# Patient Record
Sex: Female | Born: 1970 | Race: Black or African American | Hispanic: No | Marital: Single | Smoking: Never smoker
Health system: Southern US, Community
[De-identification: ages and names within clinical notes are randomized; demographics above are authoritative.]

## PROBLEM LIST (undated history)

## (undated) DIAGNOSIS — G9601 Cranial cerebrospinal fluid leak, spontaneous: Secondary | ICD-10-CM

## (undated) DIAGNOSIS — M549 Dorsalgia, unspecified: Secondary | ICD-10-CM

## (undated) DIAGNOSIS — G039 Meningitis, unspecified: Secondary | ICD-10-CM

## (undated) DIAGNOSIS — Z8719 Personal history of other diseases of the digestive system: Secondary | ICD-10-CM

## (undated) DIAGNOSIS — I10 Essential (primary) hypertension: Secondary | ICD-10-CM

## (undated) DIAGNOSIS — G43909 Migraine, unspecified, not intractable, without status migrainosus: Secondary | ICD-10-CM

## (undated) DIAGNOSIS — Z955 Presence of coronary angioplasty implant and graft: Secondary | ICD-10-CM

## (undated) DIAGNOSIS — K8020 Calculus of gallbladder without cholecystitis without obstruction: Secondary | ICD-10-CM

## (undated) DIAGNOSIS — R51 Headache: Secondary | ICD-10-CM

## (undated) DIAGNOSIS — M069 Rheumatoid arthritis, unspecified: Secondary | ICD-10-CM

## (undated) DIAGNOSIS — D649 Anemia, unspecified: Secondary | ICD-10-CM

## (undated) DIAGNOSIS — G8929 Other chronic pain: Secondary | ICD-10-CM

## (undated) HISTORY — PX: HIP SURGERY: SHX245

## (undated) HISTORY — PX: RIGHT AND LEFT HEART CATH: CATH118262

## (undated) HISTORY — PX: APPENDECTOMY: SHX54

## (undated) HISTORY — PX: ABDOMINAL HYSTERECTOMY: SHX81

## (undated) HISTORY — PX: TONSILLECTOMY: SUR1361

## (undated) HISTORY — PX: CORONARY BALLOON ANGIOPLASTY: CATH118233

## (undated) HISTORY — PX: CORONARY STENT INTERVENTION: CATH118234

## (undated) HISTORY — PX: HERNIA REPAIR: SHX51

---

## 2006-09-22 ENCOUNTER — Emergency Department (HOSPITAL_COMMUNITY): Admission: EM | Admit: 2006-09-22 | Discharge: 2006-09-23 | Payer: Self-pay | Admitting: Emergency Medicine

## 2008-11-28 ENCOUNTER — Emergency Department (HOSPITAL_BASED_OUTPATIENT_CLINIC_OR_DEPARTMENT_OTHER): Admission: EM | Admit: 2008-11-28 | Discharge: 2008-11-28 | Payer: Self-pay | Admitting: Emergency Medicine

## 2009-03-12 ENCOUNTER — Emergency Department (HOSPITAL_BASED_OUTPATIENT_CLINIC_OR_DEPARTMENT_OTHER): Admission: EM | Admit: 2009-03-12 | Discharge: 2009-03-12 | Payer: Self-pay | Admitting: Emergency Medicine

## 2009-07-30 ENCOUNTER — Emergency Department (HOSPITAL_BASED_OUTPATIENT_CLINIC_OR_DEPARTMENT_OTHER): Admission: EM | Admit: 2009-07-30 | Discharge: 2009-07-31 | Payer: Self-pay | Admitting: Emergency Medicine

## 2009-07-31 ENCOUNTER — Ambulatory Visit: Payer: Self-pay | Admitting: Diagnostic Radiology

## 2010-05-02 ENCOUNTER — Ambulatory Visit: Payer: Self-pay | Admitting: Diagnostic Radiology

## 2010-05-02 ENCOUNTER — Ambulatory Visit (HOSPITAL_BASED_OUTPATIENT_CLINIC_OR_DEPARTMENT_OTHER)
Admission: RE | Admit: 2010-05-02 | Discharge: 2010-05-02 | Payer: Self-pay | Source: Home / Self Care | Admitting: Orthopedic Surgery

## 2010-06-15 ENCOUNTER — Encounter: Admission: RE | Admit: 2010-06-15 | Discharge: 2010-06-15 | Payer: Self-pay | Admitting: Orthopedic Surgery

## 2011-04-06 LAB — GC/CHLAMYDIA PROBE AMP, GENITAL: Chlamydia, DNA Probe: NEGATIVE

## 2011-04-06 LAB — URINALYSIS, ROUTINE W REFLEX MICROSCOPIC
Bilirubin Urine: NEGATIVE
Glucose, UA: NEGATIVE mg/dL
Leukocytes, UA: NEGATIVE
Nitrite: NEGATIVE
Protein, ur: NEGATIVE mg/dL
Urobilinogen, UA: 0.2 mg/dL (ref 0.0–1.0)

## 2011-04-06 LAB — BASIC METABOLIC PANEL
GFR calc Af Amer: >60 mL/min (ref >60)
Sodium: 140 mEq/L (ref 135–145)

## 2011-04-06 LAB — CBC
HCT: 30.4 % — ABNORMAL LOW (ref 36.0–46.0)
RBC: 4.73 MIL/uL (ref 3.87–5.11)
RDW: 17.7 % — ABNORMAL HIGH (ref 11.5–15.5)
WBC: 7.6 10*3/uL (ref 4.0–10.5)

## 2011-04-06 LAB — DIFFERENTIAL
Basophils Relative: 0 % (ref 0–1)
Eosinophils Absolute: 0.1 10*3/uL (ref 0.0–0.7)
Lymphocytes Relative: 38 % (ref 12–46)
Monocytes Absolute: 0.5 10*3/uL (ref 0.1–1.0)
Neutrophils Relative %: 54 % (ref 43–77)

## 2011-04-06 LAB — PREGNANCY, URINE: Preg Test, Ur: NEGATIVE

## 2011-04-06 LAB — URINE MICROSCOPIC-ADD ON

## 2011-04-06 LAB — WET PREP, GENITAL

## 2011-10-01 LAB — URINALYSIS, ROUTINE W REFLEX MICROSCOPIC
Bilirubin Urine: NEGATIVE
Glucose, UA: NEGATIVE
Hgb urine dipstick: NEGATIVE
Protein, ur: NEGATIVE
Specific Gravity, Urine: 1.026
Urobilinogen, UA: 0.2

## 2011-10-01 LAB — DIFFERENTIAL
Basophils Absolute: 0.1
Eosinophils Absolute: 0.1
Lymphs Abs: 2.9
Monocytes Absolute: 0.7
Neutro Abs: 4.6
Neutrophils Relative %: 55

## 2011-10-01 LAB — COMPREHENSIVE METABOLIC PANEL
AST: 17
Alkaline Phosphatase: 54
Calcium: 9.4
GFR calc non Af Amer: >60
Total Bilirubin: 0.3
Total Protein: 7.5

## 2011-10-01 LAB — OCCULT BLOOD X 1 CARD TO LAB, STOOL: Fecal Occult Bld: POSITIVE

## 2011-10-01 LAB — CBC
Hemoglobin: 7.4 — CL
MCHC: 29.9 — ABNORMAL LOW
WBC: 8.4

## 2011-10-01 LAB — PREGNANCY, URINE: Preg Test, Ur: NEGATIVE

## 2011-11-24 DIAGNOSIS — F32A Depression, unspecified: Secondary | ICD-10-CM | POA: Insufficient documentation

## 2011-11-24 DIAGNOSIS — N281 Cyst of kidney, acquired: Secondary | ICD-10-CM | POA: Insufficient documentation

## 2011-11-24 DIAGNOSIS — F329 Major depressive disorder, single episode, unspecified: Secondary | ICD-10-CM | POA: Insufficient documentation

## 2011-11-24 DIAGNOSIS — Z90710 Acquired absence of both cervix and uterus: Secondary | ICD-10-CM | POA: Insufficient documentation

## 2011-11-24 DIAGNOSIS — N6009 Solitary cyst of unspecified breast: Secondary | ICD-10-CM | POA: Insufficient documentation

## 2012-06-12 ENCOUNTER — Emergency Department (HOSPITAL_BASED_OUTPATIENT_CLINIC_OR_DEPARTMENT_OTHER): Payer: BC Managed Care – PPO

## 2012-06-12 ENCOUNTER — Emergency Department (HOSPITAL_BASED_OUTPATIENT_CLINIC_OR_DEPARTMENT_OTHER)
Admission: EM | Admit: 2012-06-12 | Discharge: 2012-06-13 | Disposition: A | Discharge status: Home / Self Care | Payer: BC Managed Care – PPO | Attending: Emergency Medicine | Admitting: Emergency Medicine

## 2012-06-12 ENCOUNTER — Encounter (HOSPITAL_BASED_OUTPATIENT_CLINIC_OR_DEPARTMENT_OTHER): Payer: Self-pay | Admitting: *Deleted

## 2012-06-12 DIAGNOSIS — I10 Essential (primary) hypertension: Secondary | ICD-10-CM | POA: Insufficient documentation

## 2012-06-12 DIAGNOSIS — S335XXA Sprain of ligaments of lumbar spine, initial encounter: Secondary | ICD-10-CM | POA: Insufficient documentation

## 2012-06-12 DIAGNOSIS — E119 Type 2 diabetes mellitus without complications: Secondary | ICD-10-CM | POA: Insufficient documentation

## 2012-06-12 DIAGNOSIS — X58XXXA Exposure to other specified factors, initial encounter: Secondary | ICD-10-CM | POA: Insufficient documentation

## 2012-06-12 DIAGNOSIS — S39012A Strain of muscle, fascia and tendon of lower back, initial encounter: Secondary | ICD-10-CM

## 2012-06-12 HISTORY — DX: Essential (primary) hypertension: I10

## 2012-06-12 LAB — URINALYSIS, ROUTINE W REFLEX MICROSCOPIC
Protein, ur: NEGATIVE mg/dL
Specific Gravity, Urine: 1.038 — ABNORMAL HIGH (ref 1.005–1.030)
pH: 6.5 (ref 5.0–8.0)

## 2012-06-12 MED ORDER — TRAMADOL HCL 50 MG PO TABS
50.0000 mg | ORAL_TABLET | Freq: Once | ORAL | Status: AC
  Administered 2012-06-12: 50 mg via ORAL
  Filled 2012-06-12 (×2): qty 1

## 2012-06-12 NOTE — ED Notes (Signed)
Pt c/o lower back pain x 1 month 

## 2012-06-13 MED ORDER — HYDROCODONE-ACETAMINOPHEN 5-500 MG PO TABS: 1.0000 | ORAL_TABLET | Freq: Four times a day (QID) | ORAL | Status: AC | PRN

## 2012-06-13 MED ORDER — METHOCARBAMOL 500 MG PO TABS: 500.0000 mg | ORAL_TABLET | Freq: Two times a day (BID) | ORAL | Status: AC

## 2012-06-13 NOTE — ED Provider Notes (Signed)
History     CSN: [id number]  Arrival date & time 06/12/12  1952   First MD Initiated Contact with Patient 06/12/12 2255      Chief Complaint  Patient presents with  . Back Pain    (Consider location/radiation/quality/duration/timing/severity/associated sxs/prior treatment) Patient is a 41 y.o. female presenting with back pain. The history is provided by the patient. No language interpreter was used.  Back Pain  This is a chronic problem. The current episode started more than 1 week ago. The problem occurs constantly. The problem has not changed since onset.The pain is associated with no known injury. The pain is present in the lumbar spine. The quality of the pain is described as stabbing. The pain does not radiate. The pain is severe. The symptoms are aggravated by bending. The pain is the same all the time. Pertinent negatives include no chest pain, no fever, no numbness, no weight loss, no abdominal pain, no abdominal swelling, no bowel incontinence, no perianal numbness, no bladder incontinence, no dysuria, no pelvic pain, no leg pain, no paresthesias, no paresis, no tingling and no weakness. She has tried nothing for the symptoms. The treatment provided no relief. Risk factors include obesity.    Past Medical History  Diagnosis Date  . Hypertension   . Diabetes mellitus     Past Surgical History  Procedure Date  . Abdominal hysterectomy   . Appendectomy   . Tonsillectomy   . Cesarean section     History reviewed. No pertinent family history.  History  Substance Use Topics  . Smoking status: Never Smoker   . Smokeless tobacco: Not on file  . Alcohol Use: No    OB History    Grav Para Term Preterm Abortions TAB SAB Ect Mult Living                  Review of Systems  Constitutional: Negative for fever and weight loss.  Respiratory: Negative for cough and shortness of breath.   Cardiovascular: Negative for chest pain.  Gastrointestinal: Negative for abdominal  pain and bowel incontinence.  Genitourinary: Negative for bladder incontinence, dysuria, frequency, flank pain, vaginal discharge and pelvic pain.  Musculoskeletal: Positive for back pain.  Neurological: Negative for tingling, weakness, numbness and paresthesias.  All other systems reviewed and are negative.    Allergies  Azithromycin  Home Medications   Current Outpatient Rx  Name Route Sig Dispense Refill  . GABAPENTIN 300 MG PO CAPS Oral Take 300 mg by mouth 3 (three) times daily.    . IBUPROFEN 200 MG PO TABS Oral Take 400 mg by mouth every 6 (six) hours as needed. Patient used this medication for back pain.    Marland Kitchen LISINOPRIL 20 MG PO TABS Oral Take 20 mg by mouth daily.    Marland Kitchen METFORMIN HCL 1000 MG PO TABS Oral Take 1,000 mg by mouth 2 (two) times daily with a meal.      BP 166/100  Pulse 91  Temp 98 F (36.7 C)  Resp 16  Ht 5\' 5"  (1.651 m)  Wt 241 lb (109.317 kg)  BMI 40.10 kg/m2  SpO2 100%  Physical Exam  Constitutional: She is oriented to person, place, and time. She appears well-developed and well-nourished.  HENT:  Head: Normocephalic and atraumatic.  Mouth/Throat: Oropharynx is clear and moist.  Eyes: Conjunctivae are normal. Pupils are equal, round, and reactive to light.  Neck: Normal range of motion. Neck supple.  Cardiovascular: Normal rate and regular rhythm.   Pulmonary/Chest:  Effort normal and breath sounds normal.  Abdominal: Soft. Bowel sounds are normal.  Musculoskeletal: Normal range of motion. She exhibits no tenderness.  Neurological: She is alert and oriented to person, place, and time. She has normal reflexes.       No step off, nor point tenderness or crepitance over spine.  Intact L5/s1 intact perineal sensation, intact gait   Skin: Skin is warm and dry.  Psychiatric: She has a normal mood and affect.    ED Course  Procedures (including critical care time)  Labs Reviewed  URINALYSIS, ROUTINE W REFLEX MICROSCOPIC - Abnormal; Notable for the  following:    Color, Urine AMBER (*)  BIOCHEMICALS MAY BE AFFECTED BY COLOR   Specific Gravity, Urine 1.038 (*)     Bilirubin Urine SMALL (*)     Ketones, ur 15 (*)     All other components within normal limits  PREGNANCY, URINE   Dg Lumbar Spine Complete  06/13/2012  *RADIOLOGY REPORT*  Clinical Data: Lower back pain for 1 month.  LUMBAR SPINE - COMPLETE 4+ VIEW  Comparison: MRI of the lumbar spine performed 05/02/2010  Findings: There is no evidence of fracture or subluxation. Vertebral bodies demonstrate normal height and alignment. Intervertebral disc spaces are preserved.  The visualized neural foramina are grossly unremarkable in appearance.  The visualized bowel gas pattern is unremarkable in appearance; air and stool are noted within the colon.  The sacroiliac joints are within normal limits.  As on the prior CT from 07/31/2009, there is only a left-sided tubal ligation clip.  IMPRESSION: No evidence of fracture or subluxation along the lumbar spine.  Original Report Authenticated By: Tonia Ghent, M.D.     No diagnosis found.    MDM  Lumbar strain.  No indication for labs or advanced imaging.  Will provide pain medication.  Follow up with your family doctor.  Return for weakness numbness, bowel or bladder incontinence, fevers or any concerns.  Patient verbalizes understanding and agrees to follow up        Avacyn Kloosterman Smitty Cords, MD 06/13/12 [phone]

## 2012-06-13 NOTE — Discharge Instructions (Signed)
Back Pain, Adult Low back pain is very common. About 1 in 5 people have back pain.The cause of low back pain is rarely dangerous. The pain often gets better over time.About half of people with a sudden onset of back pain feel better in just 2 weeks. About 8 in 10 people feel better by 6 weeks.  CAUSES Some common causes of back pain include:  Strain of the muscles or ligaments supporting the spine.   Wear and tear (degeneration) of the spinal discs.   Arthritis.   Direct injury to the back.  DIAGNOSIS Most of the time, the direct cause of low back pain is not known.However, back pain can be treated effectively even when the exact cause of the pain is unknown.Answering your caregiver's questions about your overall health and symptoms is one of the most accurate ways to make sure the cause of your pain is not dangerous. If your caregiver needs more information, he or she may order lab work or imaging tests (X-rays or MRIs).However, even if imaging tests show changes in your back, this usually does not require surgery. HOME CARE INSTRUCTIONS For many people, back pain returns.Since low back pain is rarely dangerous, it is often a condition that people can learn to manageon their own.   Remain active. It is stressful on the back to sit or stand in one place. Do not sit, drive, or stand in one place for more than 30 minutes at a time. Take short walks on level surfaces as soon as pain allows.Try to increase the length of time you walk each day.   Do not stay in bed.Resting more than 1 or 2 days can delay your recovery.   Do not avoid exercise or work.Your body is made to move.It is not dangerous to be active, even though your back may hurt.Your back will likely heal faster if you return to being active before your pain is gone.   Pay attention to your body when you bend and lift. Many people have less discomfortwhen lifting if they bend their knees, keep the load close to their  bodies,and avoid twisting. Often, the most comfortable positions are those that put less stress on your recovering back.   Find a comfortable position to sleep. Use a firm mattress and lie on your side with your knees slightly bent. If you lie on your back, put a pillow under your knees.   Only take over-the-counter or prescription medicines as directed by your caregiver. Over-the-counter medicines to reduce pain and inflammation are often the most helpful.Your caregiver may prescribe muscle relaxant drugs.These medicines help dull your pain so you can more quickly return to your normal activities and healthy exercise.   Put ice on the injured area.   Put ice in a plastic bag.   Place a towel between your skin and the bag.   Leave the ice on for 15 to 20 minutes, 3 to 4 times a day for the first 2 to 3 days. After that, ice and heat may be alternated to reduce pain and spasms.   Ask your caregiver about trying back exercises and gentle massage. This may be of some benefit.   Avoid feeling anxious or stressed.Stress increases muscle tension and can worsen back pain.It is important to recognize when you are anxious or stressed and learn ways to manage it.Exercise is a great option.  SEEK MEDICAL CARE IF:  You have pain that is not relieved with rest or medicine.   You have   pain that does not improve in 1 week.   You have new symptoms.   You are generally not feeling well.  SEEK IMMEDIATE MEDICAL CARE IF:   You have pain that radiates from your back into your legs.   You develop new bowel or bladder control problems.   You have unusual weakness or numbness in your arms or legs.   You develop nausea or vomiting.   You develop abdominal pain.   You feel faint.  Document Released: 12/16/2005 Document Revised: 12/05/2011 Document Reviewed: 05/06/2011 [employer] Patient Information 2012 [employer]. 

## 2013-04-06 ENCOUNTER — Emergency Department (HOSPITAL_BASED_OUTPATIENT_CLINIC_OR_DEPARTMENT_OTHER)
Admission: EM | Admit: 2013-04-06 | Discharge: 2013-04-06 | Disposition: A | Discharge status: Home / Self Care | Payer: BC Managed Care – PPO | Attending: Emergency Medicine | Admitting: Emergency Medicine

## 2013-04-06 ENCOUNTER — Encounter (HOSPITAL_BASED_OUTPATIENT_CLINIC_OR_DEPARTMENT_OTHER): Payer: Self-pay

## 2013-04-06 DIAGNOSIS — Z79899 Other long term (current) drug therapy: Secondary | ICD-10-CM | POA: Insufficient documentation

## 2013-04-06 DIAGNOSIS — I10 Essential (primary) hypertension: Secondary | ICD-10-CM

## 2013-04-06 DIAGNOSIS — E119 Type 2 diabetes mellitus without complications: Secondary | ICD-10-CM | POA: Insufficient documentation

## 2013-04-06 LAB — CBC WITH DIFFERENTIAL/PLATELET
Basophils Absolute: 0 10*3/uL (ref 0.0–0.1)
Basophils Relative: 0 % (ref 0–1)
HCT: 39.2 % (ref 36.0–46.0)
MCHC: 33.7 g/dL (ref 30.0–36.0)
Monocytes Absolute: 0.5 10*3/uL (ref 0.1–1.0)
Neutro Abs: 2.3 10*3/uL (ref 1.7–7.7)
Neutrophils Relative %: 38 % — ABNORMAL LOW (ref 43–77)
Platelets: 250 10*3/uL (ref 150–400)
RDW: 12.7 % (ref 11.5–15.5)

## 2013-04-06 LAB — BASIC METABOLIC PANEL
Chloride: 101 mEq/L (ref 96–112)
Creatinine, Ser: 0.6 mg/dL (ref 0.50–1.10)
GFR calc Af Amer: >90 mL/min (ref >90)
Sodium: 139 mEq/L (ref 135–145)

## 2013-04-06 MED ORDER — METOPROLOL TARTRATE 1 MG/ML IV SOLN
5.0000 mg | Freq: Once | INTRAVENOUS | Status: AC
  Administered 2013-04-06: 5 mg via INTRAVENOUS
  Filled 2013-04-06: qty 5

## 2013-04-06 MED ORDER — AMLODIPINE BESYLATE 5 MG PO TABS
10.0000 mg | ORAL_TABLET | Freq: Once | ORAL | Status: AC
  Administered 2013-04-06: 10 mg via ORAL
  Filled 2013-04-06: qty 2

## 2013-04-06 MED ORDER — AMLODIPINE BESYLATE 10 MG PO TABS: 5.0000 mg | ORAL_TABLET | Freq: Every day | ORAL | Status: DC

## 2013-04-06 NOTE — ED Notes (Signed)
C/o elevated BP x 2 weeks-tingling to left arm started yesterday-pressure to face

## 2013-04-06 NOTE — ED Notes (Signed)
Md made aware of BP cont to d/c

## 2013-04-06 NOTE — ED Provider Notes (Addendum)
History     CSN: [id number]  Arrival date & time 04/06/13  1958   First MD Initiated Contact with Patient 04/06/13 2017      Chief Complaint  Patient presents with  . Hypertension    (Consider location/radiation/quality/duration/timing/severity/associated sxs/prior treatment) HPI 42 y.o. Female with history of hypertension went to [facility] for hypertension and states bp continues high.  Patient states seen at [facility] and given one time bp pill and felt a little better.  Continues with losartan since October and hasn't been back to primary care doctor.  Complains of pressure in face and seen at [facility] then called clinic and told her to go to hospital.  Takes losartan at 11 a.m.  BP checks bp in the morning before work and has been elevated at 170/118, 168/119 in a.m.  Feels pressure in face.  BP elevated since 20s, "runs in family".   Past Medical History  Diagnosis Date  . Hypertension   . Diabetes mellitus     Past Surgical History  Procedure Laterality Date  . Abdominal hysterectomy    . Appendectomy    . Tonsillectomy    . Cesarean section      No family history on file.  History  Substance Use Topics  . Smoking status: Never Smoker   . Smokeless tobacco: Not on file  . Alcohol Use: No    OB History   Grav Para Term Preterm Abortions TAB SAB Ect Mult Living                  Review of Systems  All other systems reviewed and are negative.    Allergies  Erythromycin  Home Medications   Current Outpatient Rx  Name  Route  Sig  Dispense  Refill  . losartan-hydrochlorothiazide (HYZAAR) 100-25 MG per tablet   Oral   Take 1 tablet by mouth daily.         Marland Kitchen gabapentin (NEURONTIN) 300 MG capsule   Oral   Take 300 mg by mouth 3 (three) times daily.         Marland Kitchen ibuprofen (ADVIL,MOTRIN) 200 MG tablet   Oral   Take 400 mg by mouth every 6 (six) hours as needed. Patient used this medication for back pain.         Marland Kitchen lisinopril (PRINIVIL,ZESTRIL) 20 MG tablet   Oral   Take 20 mg by mouth daily.         . metFORMIN (GLUCOPHAGE) 1000 MG tablet   Oral   Take 1,000 mg by mouth 2 (two) times daily with a meal.           BP 182/111  Pulse 76  Temp(Src) 98.7 F (37.1 C) (Oral)  Resp 20  Ht 5\' 5"  (1.651 m)  Wt 230 lb (104.327 kg)  BMI 38.27 kg/m2  SpO2 98%  Physical Exam  Nursing note and vitals reviewed. Constitutional: She is oriented to person, place, and time. She appears well-developed and well-nourished.  HENT:  Head: Normocephalic and atraumatic.  Right Ear: External ear normal.  Left Ear: External ear normal.  Nose: Nose normal.  Mouth/Throat: Oropharynx is clear and moist.  Eyes: Conjunctivae and EOM are normal. Pupils are equal, round, and reactive to light.  Neck: Normal range of motion. Neck supple.  Cardiovascular: Normal rate and regular rhythm.   Pulmonary/Chest: Effort normal and breath sounds normal.  Abdominal: Soft. Bowel sounds are normal.  Musculoskeletal: Normal range of motion.  Neurological: She is alert and oriented to  person, place, and time. She has normal reflexes.  Skin: Skin is warm and dry.  Psychiatric: She has a normal mood and affect. Her behavior is normal. Judgment and thought content normal.    ED Course  Procedures (including critical care time)  Labs Reviewed - No data to display No results found.   No diagnosis found.    MDM  Patient with decreased blood pressure here with Lopressor and Norvasc is added to her regimen. She is advised that she should stay on Norvasc and call her doctor tomorrow to have her blood pressure rechecked as an outpatient. Recheck blood pressure here 135/84.       Hilario Quarry, MD 04/06/13 0454  Hilario Quarry, MD 04/24/13 1622  Hilario Quarry, MD 04/24/13 1623  Hilario Quarry, MD 04/24/13 0981

## 2013-05-27 DIAGNOSIS — E7800 Pure hypercholesterolemia, unspecified: Secondary | ICD-10-CM | POA: Insufficient documentation

## 2013-05-27 DIAGNOSIS — E669 Obesity, unspecified: Secondary | ICD-10-CM | POA: Insufficient documentation

## 2013-06-05 DIAGNOSIS — K219 Gastro-esophageal reflux disease without esophagitis: Secondary | ICD-10-CM | POA: Insufficient documentation

## 2013-06-05 DIAGNOSIS — I10 Essential (primary) hypertension: Secondary | ICD-10-CM | POA: Insufficient documentation

## 2013-11-09 ENCOUNTER — Emergency Department (HOSPITAL_BASED_OUTPATIENT_CLINIC_OR_DEPARTMENT_OTHER)
Admission: EM | Admit: 2013-11-09 | Discharge: 2013-11-09 | Disposition: A | Discharge status: Home / Self Care | Payer: BC Managed Care – PPO | Attending: Emergency Medicine | Admitting: Emergency Medicine

## 2013-11-09 ENCOUNTER — Encounter (HOSPITAL_BASED_OUTPATIENT_CLINIC_OR_DEPARTMENT_OTHER): Payer: Self-pay | Admitting: Emergency Medicine

## 2013-11-09 ENCOUNTER — Emergency Department (HOSPITAL_BASED_OUTPATIENT_CLINIC_OR_DEPARTMENT_OTHER): Payer: BC Managed Care – PPO

## 2013-11-09 DIAGNOSIS — Z79899 Other long term (current) drug therapy: Secondary | ICD-10-CM | POA: Insufficient documentation

## 2013-11-09 DIAGNOSIS — E119 Type 2 diabetes mellitus without complications: Secondary | ICD-10-CM | POA: Insufficient documentation

## 2013-11-09 DIAGNOSIS — I10 Essential (primary) hypertension: Secondary | ICD-10-CM | POA: Insufficient documentation

## 2013-11-09 DIAGNOSIS — R0789 Other chest pain: Secondary | ICD-10-CM | POA: Insufficient documentation

## 2013-11-09 DIAGNOSIS — L02219 Cutaneous abscess of trunk, unspecified: Secondary | ICD-10-CM | POA: Insufficient documentation

## 2013-11-09 DIAGNOSIS — R109 Unspecified abdominal pain: Secondary | ICD-10-CM

## 2013-11-09 DIAGNOSIS — L03311 Cellulitis of abdominal wall: Secondary | ICD-10-CM

## 2013-11-09 DIAGNOSIS — R110 Nausea: Secondary | ICD-10-CM | POA: Insufficient documentation

## 2013-11-09 DIAGNOSIS — K8020 Calculus of gallbladder without cholecystitis without obstruction: Secondary | ICD-10-CM

## 2013-11-09 LAB — CBC WITH DIFFERENTIAL/PLATELET
Basophils Absolute: 0 10*3/uL (ref 0.0–0.1)
HCT: 39.3 % (ref 36.0–46.0)
Lymphocytes Relative: 46 % (ref 12–46)
Monocytes Absolute: 0.4 10*3/uL (ref 0.1–1.0)
Neutro Abs: 2.4 10*3/uL (ref 1.7–7.7)
Platelets: 299 10*3/uL (ref 150–400)
RDW: 12.8 % (ref 11.5–15.5)
WBC: 5.4 10*3/uL (ref 4.0–10.5)

## 2013-11-09 LAB — COMPREHENSIVE METABOLIC PANEL
ALT: 19 U/L (ref 0–35)
AST: 17 U/L (ref 0–37)
Alkaline Phosphatase: 45 U/L (ref 39–117)
CO2: 27 mEq/L (ref 19–32)
Chloride: 101 mEq/L (ref 96–112)
GFR calc non Af Amer: >90 mL/min (ref >90)
Sodium: 138 mEq/L (ref 135–145)
Total Bilirubin: 0.3 mg/dL (ref 0.3–1.2)

## 2013-11-09 MED ORDER — CEFTRIAXONE SODIUM 1 G IJ SOLR
INTRAMUSCULAR | Status: AC
  Administered 2013-11-09: 1000 mg
  Filled 2013-11-09: qty 10

## 2013-11-09 MED ORDER — ONDANSETRON HCL 4 MG/2ML IJ SOLN
4.0000 mg | Freq: Once | INTRAMUSCULAR | Status: AC
  Administered 2013-11-09: 4 mg via INTRAVENOUS
  Filled 2013-11-09: qty 2

## 2013-11-09 MED ORDER — HYDROCODONE-ACETAMINOPHEN 5-325 MG PO TABS: 2.0000 | ORAL_TABLET | ORAL | Status: DC | PRN

## 2013-11-09 MED ORDER — DEXTROSE 5 % IV SOLN: 1.0000 g | Freq: Once | INTRAVENOUS | Status: DC

## 2013-11-09 MED ORDER — MORPHINE SULFATE 4 MG/ML IJ SOLN
4.0000 mg | INTRAMUSCULAR | Status: DC | PRN
  Administered 2013-11-09 (×2): 4 mg via INTRAVENOUS
  Filled 2013-11-09 (×2): qty 1

## 2013-11-09 NOTE — ED Notes (Signed)
Abdominal pain and swelling. She has had drainage from her navel for a week. She was seen at [facility] and given an unknown antibiotic that she is still taking.

## 2013-11-09 NOTE — ED Provider Notes (Signed)
CSN: [id number]     Arrival date & time 11/09/13  1134 History   First MD Initiated Contact with Patient 11/09/13 1206     Chief Complaint  Patient presents with  . Abdominal Pain    HPI  2 complaints. First is that she's had upper abdominal pain after meals "for a while". Initially is rather evasive. 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 Cannot recall that she's ever had EGD, CT, ultrasound, lab testing. Describes the pain as sudden and severe and her epigastrium after meals occasionally to her back. Mild associated nausea. No postprandial vomiting. Secondly, she states that she has a drainage from her belly button. Was started on an antibiotic she is taking "either 2 or 4 times a day". She was started on the clindamycin at [facility] and Saturday. Noted some drainage from the umbilicus this morning. Wanted reevaluated.  Past Medical History  Diagnosis Date  . Hypertension   . Diabetes mellitus    Past Surgical History  Procedure Laterality Date  . Abdominal hysterectomy    . Appendectomy    . Tonsillectomy    . Cesarean section     No family history on file. History  Substance Use Topics  . Smoking status: Never Smoker   . Smokeless tobacco: Not on file  . Alcohol Use: No   OB History   Grav Para Term Preterm Abortions TAB SAB Ect Mult Living                 Review of Systems  Constitutional: Negative for fatigue.  HENT: Negative for ear pain, sinus pressure and sore throat.   Eyes: Negative for visual disturbance.  Respiratory: Positive for chest tightness.   Cardiovascular: Negative for chest pain.  Gastrointestinal: Positive for abdominal pain.       Postprandial epigastric pain. Now. He umbilical pain with drainage  Endocrine: Negative for polyuria.  Genitourinary: Negative for dysuria, urgency, frequency, flank pain, vaginal  bleeding and difficulty urinating.  Musculoskeletal: Negative for arthralgias and joint swelling.  Skin: Positive for wound.  Neurological: Negative for weakness and numbness.    Allergies  Erythromycin  Home Medications   Current Outpatient Rx  Name  Route  Sig  Dispense  Refill  . amLODipine (NORVASC) 10 MG tablet   Oral   Take 0.5 tablets (5 mg total) by mouth daily.   30 tablet   0   . gabapentin (NEURONTIN) 300 MG capsule   Oral   Take 300 mg by mouth 3 (three) times daily.         Marland Kitchen HYDROcodone-acetaminophen (NORCO/VICODIN) 5-325 MG per tablet   Oral   Take 2 tablets by mouth every 4 (four) hours as needed.   10 tablet   0   . ibuprofen (ADVIL,MOTRIN) 200 MG tablet   Oral   Take 400 mg by mouth every 6 (six) hours as needed. Patient used this medication for back pain.         Marland Kitchen lisinopril (PRINIVIL,ZESTRIL) 20 MG tablet   Oral   Take 20 mg by mouth daily.         Marland Kitchen losartan-hydrochlorothiazide (HYZAAR) 100-25 MG per tablet   Oral   Take 1 tablet by mouth daily.         . metFORMIN (GLUCOPHAGE) 1000 MG tablet   Oral   Take 1,000 mg by mouth 2 (  two) times daily with a meal.          BP 126/89  Pulse 75  Temp(Src) 98.3 F (36.8 C) (Oral)  Resp 20  SpO2 100% Physical Exam  Constitutional: She is oriented to person, place, and time. She appears well-developed and well-nourished. No distress.  HENT:  Head: Normocephalic.  Eyes: Conjunctivae are normal. Pupils are equal, round, and reactive to light. No scleral icterus.  Neck: Normal range of motion. Neck supple. No thyromegaly present.  Cardiovascular: Normal rate and regular rhythm.  Exam reveals no gallop and no friction rub.   No murmur heard. Pulmonary/Chest: Effort normal and breath sounds normal. No respiratory distress. She has no wheezes. She has no rales.  Abdominal: Soft. Bowel sounds are normal. She exhibits no distension. There is no tenderness. There is no rebound.  No reproducible  tenderness in her and her epigastrium and right quadrant. Normal Murphy's sign.  Umbilicus shows some tenderness. No thickening of skin. No frank cellulitis, or abscess No obvious draining sinus  Musculoskeletal: Normal range of motion.  Neurological: She is alert and oriented to person, place, and time.  Skin: Skin is warm and dry. No rash noted.  Psychiatric: She has a normal mood and affect. Her behavior is normal.    ED Course  Procedures (including critical care time) Labs Review Labs Reviewed  COMPREHENSIVE METABOLIC PANEL - Abnormal; Notable for the following:    Glucose, Bld 158 (*)    All other components within normal limits  CBC WITH DIFFERENTIAL  LIPASE, BLOOD   Imaging Review Ct Abdomen Pelvis Wo Contrast  11/09/2013   CLINICAL DATA:  Abdominal pain and swelling. Drainage from navel region for the past week. Question abdominal wall abscess. Diabetic hypertensive patient post appendectomy and cesarean section.  EXAM: CT ABDOMEN AND PELVIS WITHOUT CONTRAST  TECHNIQUE: Multidetector CT imaging of the abdomen and pelvis was performed following the standard protocol without intravenous contrast.  COMPARISON:  08/16/2012 CT.  FINDINGS: Lung bases clear.  Slight thickening peri umbilical region unchanged from prior examination without evidence of drainable abscess. No connection to underlying bowel.  Right renal 3.8 cm cyst. Elongated fatty liver spanning over 22 cm. Taking into account limitation by non contrast imaging, no worrisome hepatic, splenic, pancreatic, adrenal or renal abnormality.  Radiopaque material within the gallbladder may represent sludge or small stones.  Heart size within normal limits. Trace pericardial fluid.  CORONARY ARTERIES appears slightly dense although difficult to confirm calcification of the coronary arteries.  No abdominal aortic aneurysm.  Surgical clips left pelvis.  No extra luminal bowel inflammatory process, free fluid or free air.  Decompressed  urinary bladder.  Post hysterectomy.  IMPRESSION: Slight thickening peri umbilical region unchanged from prior examination without evidence of drainable abscess. No connection to underlying bowel.  Right renal 3.8 cm cyst.  Elongated fatty liver spanning over 22 cm.  Radiopaque material within the gallbladder may represent sludge or small stones.  No extra luminal bowel inflammatory process, free fluid or free air.   Electronically Signed   By: Bridgett Larsson M.D.   On: 11/09/2013 13:00    EKG Interpretation   None       MDM   1. Abdominal wall cellulitis   2. Cholelithiasis   3. Abdominal pain    Clinically as a cellulitis. May have had an abscess drained and she does scribed drainage from the umbilicus. No palpable fluctuance on exam. No frank abscess on CT. Has multiple gallstones on CT. Plan  will continue her clindamycin which she started on 3 days ago that was completed. Given IV Rocephin here. Continuous hygiene washing and cleaning and using antibiotic ointment around her bellybutton. Surgical followup regarding her gallstones.    Roney Marion, MD 11/09/13 1340

## 2013-11-10 ENCOUNTER — Telehealth (INDEPENDENT_AMBULATORY_CARE_PROVIDER_SITE_OTHER): Payer: Self-pay | Admitting: General Surgery

## 2013-11-10 ENCOUNTER — Ambulatory Visit (INDEPENDENT_AMBULATORY_CARE_PROVIDER_SITE_OTHER): Payer: BC Managed Care – PPO | Admitting: General Surgery

## 2013-11-10 ENCOUNTER — Encounter (INDEPENDENT_AMBULATORY_CARE_PROVIDER_SITE_OTHER): Payer: Self-pay | Admitting: General Surgery

## 2013-11-10 VITALS — BP 122/72 | HR 68 | Temp 97.5°F | Resp 14 | Ht 65.0 in | Wt 223.0 lb

## 2013-11-10 DIAGNOSIS — K8020 Calculus of gallbladder without cholecystitis without obstruction: Secondary | ICD-10-CM

## 2013-11-10 NOTE — Patient Instructions (Signed)
Plan for lap chole with ioc 

## 2013-11-10 NOTE — Progress Notes (Signed)
Patient ID: Leslie Gentry, female   DOB: 05-Mar-1971, 42 y.o.   MRN: [id number]  Chief Complaint  Patient presents with  . Cholelithiasis    HPI Leslie Gentry is a 42 y.o. female.  We're asked to see the patient in consultation by Dr. Fayrene Fearing to evaluate her for gallstones. The patient is a 42 year old black female who has been complaining of stomach pains for the last few months. The pains are located on the right side. She has no nausea or vomiting with it but has not eaten because of the pain. The pain has been fairly constant. She went to the emergency department where a CT scan was performed that suggested there may be some stones or sludge in the gallbladder. Her liver functions were normal.  HPI  Past Medical History  Diagnosis Date  . Hypertension   . Diabetes mellitus     Past Surgical History  Procedure Laterality Date  . Abdominal hysterectomy    . Appendectomy    . Tonsillectomy    . Cesarean section      No family history on file.  Social History History  Substance Use Topics  . Smoking status: Never Smoker   . Smokeless tobacco: Not on file  . Alcohol Use: No    Allergies  Allergen Reactions  . Erythromycin Hives    Current Outpatient Prescriptions  Medication Sig Dispense Refill  . amLODipine (NORVASC) 10 MG tablet Take 0.5 tablets (5 mg total) by mouth daily.  30 tablet  0  . gabapentin (NEURONTIN) 300 MG capsule Take 300 mg by mouth 3 (three) times daily.      Marland Kitchen HYDROcodone-acetaminophen (NORCO/VICODIN) 5-325 MG per tablet Take 2 tablets by mouth every 4 (four) hours as needed.  10 tablet  0  . ibuprofen (ADVIL,MOTRIN) 200 MG tablet Take 400 mg by mouth every 6 (six) hours as needed. Patient used this medication for back pain.      Marland Kitchen lisinopril (PRINIVIL,ZESTRIL) 20 MG tablet Take 20 mg by mouth daily.      Marland Kitchen losartan-hydrochlorothiazide (HYZAAR) 100-25 MG per tablet Take 1 tablet by mouth daily.      . metFORMIN (GLUCOPHAGE) 1000 MG tablet  Take 1,000 mg by mouth 2 (two) times daily with a meal.       No current facility-administered medications for this visit.    Review of Systems Review of Systems  Constitutional: Negative.   HENT: Negative.   Eyes: Negative.   Respiratory: Negative.   Cardiovascular: Negative.   Gastrointestinal: Positive for abdominal pain.  Endocrine: Negative.   Genitourinary: Negative.   Musculoskeletal: Negative.   Skin: Negative.   Allergic/Immunologic: Negative.   Neurological: Negative.   Hematological: Negative.   Psychiatric/Behavioral: Negative.     Blood pressure 122/72, pulse 68, temperature 97.5 F (36.4 C), temperature source Temporal, resp. rate 14, height 5\' 5"  (1.651 m), weight 223 lb (101.152 kg).  Physical Exam Physical Exam  Constitutional: She is oriented to person, place, and time. She appears well-developed and well-nourished.  HENT:  Head: Normocephalic and atraumatic.  Eyes: Conjunctivae and EOM are normal. Pupils are equal, round, and reactive to light.  Neck: Normal range of motion. Neck supple.  Cardiovascular: Normal rate, regular rhythm and normal heart sounds.   Pulmonary/Chest: Effort normal and breath sounds normal.  Abdominal: Soft. Bowel sounds are normal.  There is moderate right-sided tenderness but no guarding or peritonitis. There is no palpable mass.  Musculoskeletal: Normal range of motion.  Neurological: She is  alert and oriented to person, place, and time.  Skin: Skin is warm and dry.  Psychiatric: She has a normal mood and affect. Her behavior is normal.    Data Reviewed As above  Assessment    The patient appears to have either some sludge or some very small stones in the gallbladder. It is certainly possible that this could be causing her pain although I cannot guarantee it. Because there is a chance that sludge in the gallbladder could cause her pain I think it would be reasonable to remove the gallbladder and take it out of the equation.  I have discussed with her in detail the risks and benefits of the operation and digits as well as some of the technical aspects and she understands and wishes to proceed. She understands that there is no guarantee that removing the gallbladder will fix her pain.     Plan    Plan for laparoscopic cholecystectomy with intraoperative cholangiogram        TOTH III,PAUL S 11/10/2013, 2:33 PM

## 2013-11-10 NOTE — Telephone Encounter (Signed)
I met with pt and went over financial responsibility and benefits. Patient will call me back when she is ready to schedule. skm

## 2013-11-24 ENCOUNTER — Telehealth (INDEPENDENT_AMBULATORY_CARE_PROVIDER_SITE_OTHER): Payer: Self-pay | Admitting: General Surgery

## 2013-11-24 NOTE — Telephone Encounter (Signed)
11/26 I have left messages on 11/25 and 11/26 for pt to return my call to schedule her surgery. Placed in pending skm

## 2013-12-13 ENCOUNTER — Encounter (HOSPITAL_COMMUNITY): Payer: Self-pay | Admitting: Emergency Medicine

## 2013-12-13 ENCOUNTER — Emergency Department (HOSPITAL_COMMUNITY)
Admission: EM | Admit: 2013-12-13 | Discharge: 2013-12-14 | Disposition: A | Discharge status: Home / Self Care | Payer: BC Managed Care – PPO | Attending: Emergency Medicine | Admitting: Emergency Medicine

## 2013-12-13 ENCOUNTER — Emergency Department (HOSPITAL_COMMUNITY): Payer: BC Managed Care – PPO

## 2013-12-13 DIAGNOSIS — Z90710 Acquired absence of both cervix and uterus: Secondary | ICD-10-CM | POA: Insufficient documentation

## 2013-12-13 DIAGNOSIS — Z79899 Other long term (current) drug therapy: Secondary | ICD-10-CM | POA: Insufficient documentation

## 2013-12-13 DIAGNOSIS — Z9889 Other specified postprocedural states: Secondary | ICD-10-CM | POA: Insufficient documentation

## 2013-12-13 DIAGNOSIS — I10 Essential (primary) hypertension: Secondary | ICD-10-CM | POA: Insufficient documentation

## 2013-12-13 DIAGNOSIS — E119 Type 2 diabetes mellitus without complications: Secondary | ICD-10-CM | POA: Insufficient documentation

## 2013-12-13 DIAGNOSIS — R1011 Right upper quadrant pain: Secondary | ICD-10-CM | POA: Insufficient documentation

## 2013-12-13 DIAGNOSIS — R112 Nausea with vomiting, unspecified: Secondary | ICD-10-CM | POA: Insufficient documentation

## 2013-12-13 DIAGNOSIS — G8929 Other chronic pain: Secondary | ICD-10-CM | POA: Insufficient documentation

## 2013-12-13 DIAGNOSIS — Z8719 Personal history of other diseases of the digestive system: Secondary | ICD-10-CM | POA: Insufficient documentation

## 2013-12-13 HISTORY — DX: Calculus of gallbladder without cholecystitis without obstruction: K80.20

## 2013-12-13 LAB — CBC WITH DIFFERENTIAL/PLATELET
Basophils Absolute: 0 10*3/uL (ref 0.0–0.1)
Basophils Relative: 0 % (ref 0–1)
Eosinophils Absolute: 0.1 10*3/uL (ref 0.0–0.7)
Eosinophils Relative: 1 % (ref 0–5)
HCT: 37.9 % (ref 36.0–46.0)
Hemoglobin: 12.6 g/dL (ref 12.0–15.0)
Lymphs Abs: 2.9 10*3/uL (ref 0.7–4.0)
MCH: 28.7 pg (ref 26.0–34.0)
MCHC: 33.2 g/dL (ref 30.0–36.0)
MCV: 86.3 fL (ref 78.0–100.0)
Monocytes Relative: 9 % (ref 3–12)
Platelets: 264 10*3/uL (ref 150–400)
RBC: 4.39 MIL/uL (ref 3.87–5.11)

## 2013-12-13 LAB — COMPREHENSIVE METABOLIC PANEL
AST: 17 U/L (ref 0–37)
Albumin: 3.7 g/dL (ref 3.5–5.2)
BUN: 13 mg/dL (ref 6–23)
Calcium: 9.5 mg/dL (ref 8.4–10.5)
Creatinine, Ser: 0.71 mg/dL (ref 0.50–1.10)
GFR calc Af Amer: >90 mL/min (ref >90)
Glucose, Bld: 151 mg/dL — ABNORMAL HIGH (ref 70–99)
Sodium: 140 mEq/L (ref 135–145)
Total Bilirubin: 0.2 mg/dL — ABNORMAL LOW (ref 0.3–1.2)
Total Protein: 7.1 g/dL (ref 6.0–8.3)

## 2013-12-13 LAB — LIPASE, BLOOD: Lipase: 45 U/L (ref 11–59)

## 2013-12-13 MED ORDER — MORPHINE SULFATE 4 MG/ML IJ SOLN
4.0000 mg | Freq: Once | INTRAMUSCULAR | Status: AC
  Administered 2013-12-14: 4 mg via INTRAVENOUS
  Filled 2013-12-13: qty 1

## 2013-12-13 MED ORDER — SODIUM CHLORIDE 0.9 % IV BOLUS (SEPSIS)
1000.0000 mL | Freq: Once | INTRAVENOUS | Status: AC
  Administered 2013-12-14: 1000 mL via INTRAVENOUS

## 2013-12-13 MED ORDER — ONDANSETRON HCL 4 MG/2ML IJ SOLN
4.0000 mg | Freq: Once | INTRAMUSCULAR | Status: AC
  Administered 2013-12-14: 4 mg via INTRAVENOUS
  Filled 2013-12-13: qty 2

## 2013-12-13 NOTE — ED Notes (Signed)
Pt. reports right abdominal pain for several months got worse these past several days with nausea and vomitting , pt. stated history history of gallstones .

## 2013-12-13 NOTE — ED Notes (Signed)
Dr. yelverton at bedside. 

## 2013-12-14 LAB — URINALYSIS, ROUTINE W REFLEX MICROSCOPIC
Bilirubin Urine: NEGATIVE
Glucose, UA: NEGATIVE mg/dL
Hgb urine dipstick: NEGATIVE
Specific Gravity, Urine: 1.025 (ref 1.005–1.030)

## 2013-12-14 MED ORDER — MORPHINE SULFATE 4 MG/ML IJ SOLN
4.0000 mg | Freq: Once | INTRAMUSCULAR | Status: AC
  Administered 2013-12-14: 4 mg via INTRAVENOUS
  Filled 2013-12-14: qty 1

## 2013-12-14 MED ORDER — ONDANSETRON 4 MG PO TBDP: ORAL_TABLET | ORAL | Status: DC

## 2013-12-14 NOTE — ED Provider Notes (Signed)
CSN: [id number]     Arrival date & time 12/13/13  2006 History   First MD Initiated Contact with Patient 12/13/13 2303     Chief Complaint  Patient presents with  . Abdominal Pain   (Consider location/radiation/quality/duration/timing/severity/associated sxs/prior Treatment) HPI Patient has a history of known gallstones and was supposed to have her gallbladder removed in November. She states she cannot afford the surgery has been trying to reschedule without success. She's been having intermittent right upper quadrant pain after eating and states the pain started yesterday and has been constant. She's had nausea with several episodes of vomiting associated with it. She's had no blood in either the vomit or her stool. She denies any fevers or chills. She has no chest pain or shortness of breath. She is asking to have her gallbladder taken out in the emergency department. Past Medical History  Diagnosis Date  . Hypertension   . Diabetes mellitus   . Gallstones    Past Surgical History  Procedure Laterality Date  . Abdominal hysterectomy    . Appendectomy    . Tonsillectomy    . Cesarean section     No family history on file. History  Substance Use Topics  . Smoking status: Never Smoker   . Smokeless tobacco: Not on file  . Alcohol Use: No   OB History   Grav Para Term Preterm Abortions TAB SAB Ect Mult Living                 Review of Systems  Constitutional: Negative for fever and chills.  Cardiovascular: Negative for chest pain.  Gastrointestinal: Positive for nausea, vomiting and abdominal pain. Negative for diarrhea and constipation.  Genitourinary: Negative for dysuria and flank pain.  Musculoskeletal: Negative for back pain, neck pain and neck stiffness.  Skin: Negative for rash and wound.  Neurological: Negative for dizziness, weakness, light-headedness and numbness.  All other systems reviewed and are negative.    Allergies  Erythromycin  Home Medications    Current Outpatient Rx  Name  Route  Sig  Dispense  Refill  . amLODipine (NORVASC) 10 MG tablet   Oral   Take 10 mg by mouth daily.         Marland Kitchen gabapentin (NEURONTIN) 300 MG capsule   Oral   Take 300 mg by mouth 3 (three) times daily.         . metFORMIN (GLUCOPHAGE) 1000 MG tablet   Oral   Take 1,000 mg by mouth 2 (two) times daily with a meal.          BP 144/95  Pulse 59  Temp(Src) 97.5 F (36.4 C) (Oral)  Resp 18  Wt 222 lb 3.2 oz (100.789 kg)  SpO2 100% Physical Exam  Nursing note and vitals reviewed. Constitutional: She is oriented to person, place, and time. She appears well-developed and well-nourished. No distress.  HENT:  Head: Normocephalic and atraumatic.  Mouth/Throat: Oropharynx is clear and moist.  Eyes: EOM are normal. Pupils are equal, round, and reactive to light.  Neck: Normal range of motion. Neck supple.  Cardiovascular: Normal rate and regular rhythm.   Pulmonary/Chest: Effort normal and breath sounds normal. No respiratory distress. She has no wheezes. She has no rales.  Abdominal: Soft. Bowel sounds are normal. She exhibits no distension and no mass. There is tenderness (tenderness to palpation in the right upper quadrant.). There is no rebound and no guarding.  Musculoskeletal: Normal range of motion. She exhibits no edema and no tenderness.  Neurological: She is alert and oriented to person, place, and time.  Patient is alert and oriented x3 with clear, goal oriented speech. Patient has 5/5 motor in all extremities. Sensation is intact to light touch.    Skin: Skin is warm and dry. No rash noted. No erythema.  Psychiatric: She has a normal mood and affect. Her behavior is normal.    ED Course  Procedures (including critical care time) Labs Review Labs Reviewed  CBC WITH DIFFERENTIAL - Abnormal; Notable for the following:    Neutrophils Relative % 41 (*)    Lymphocytes Relative 49 (*)    All other components within normal limits   COMPREHENSIVE METABOLIC PANEL - Abnormal; Notable for the following:    Glucose, Bld 151 (*)    Total Bilirubin 0.2 (*)    All other components within normal limits  LIPASE, BLOOD  URINALYSIS, ROUTINE W REFLEX MICROSCOPIC   Imaging Review No results found.  EKG Interpretation   None       MDM    On further questioning the patient states that this pain is the same as the chronic pain she's been having for several years. She seen a gastroenterologist and been diagnosed with dumping syndrome. She states she came to the emergency department because she was tired of having pain. Ultrasound shows no evidence of cholecystitis or gallstones. She has normal lab values. She's had recent CTs in the past. I note there is any value in repeating this. We'll treat the patient's symptoms and she's been encouraged to followup with her gastroenterologist for ongoing management of her chronic pain.  Patient is comfortable-appearing though still has minor right upper quadrant pain with palpation. She has no rebound or guarding. She's been observed in the emergency Department >8 hours. Her symptoms appear to be chronic and she's been advised to follow with her gastroenterologist. Return precautions have been given.  Loren Racer, MD 12/14/13 [phone]

## 2013-12-21 ENCOUNTER — Encounter (HOSPITAL_COMMUNITY): Payer: Self-pay | Admitting: *Deleted

## 2013-12-21 ENCOUNTER — Other Ambulatory Visit (INDEPENDENT_AMBULATORY_CARE_PROVIDER_SITE_OTHER): Payer: Self-pay | Admitting: General Surgery

## 2013-12-28 ENCOUNTER — Encounter (HOSPITAL_COMMUNITY): Payer: BC Managed Care – PPO | Admitting: Anesthesiology

## 2013-12-28 ENCOUNTER — Ambulatory Visit (HOSPITAL_COMMUNITY): Payer: BC Managed Care – PPO | Admitting: Anesthesiology

## 2013-12-28 ENCOUNTER — Ambulatory Visit (HOSPITAL_COMMUNITY)
Admission: RE | Admit: 2013-12-28 | Discharge: 2013-12-28 | Disposition: A | Discharge status: Home / Self Care | Payer: BC Managed Care – PPO | Source: Ambulatory Visit | Attending: General Surgery | Admitting: General Surgery

## 2013-12-28 ENCOUNTER — Encounter (HOSPITAL_COMMUNITY)
Admission: RE | Disposition: A | Discharge status: Home / Self Care | Payer: Self-pay | Source: Ambulatory Visit | Attending: General Surgery

## 2013-12-28 ENCOUNTER — Encounter (HOSPITAL_COMMUNITY): Payer: Self-pay | Admitting: *Deleted

## 2013-12-28 ENCOUNTER — Ambulatory Visit (HOSPITAL_COMMUNITY): Payer: BC Managed Care – PPO

## 2013-12-28 DIAGNOSIS — Z79899 Other long term (current) drug therapy: Secondary | ICD-10-CM | POA: Insufficient documentation

## 2013-12-28 DIAGNOSIS — E669 Obesity, unspecified: Secondary | ICD-10-CM | POA: Insufficient documentation

## 2013-12-28 DIAGNOSIS — K811 Chronic cholecystitis: Secondary | ICD-10-CM | POA: Insufficient documentation

## 2013-12-28 DIAGNOSIS — I10 Essential (primary) hypertension: Secondary | ICD-10-CM | POA: Insufficient documentation

## 2013-12-28 DIAGNOSIS — E119 Type 2 diabetes mellitus without complications: Secondary | ICD-10-CM | POA: Insufficient documentation

## 2013-12-28 HISTORY — DX: Anemia, unspecified: D64.9

## 2013-12-28 HISTORY — PX: CHOLECYSTECTOMY: SHX55

## 2013-12-28 HISTORY — DX: Personal history of other diseases of the digestive system: Z87.19

## 2013-12-28 HISTORY — DX: Headache: R51

## 2013-12-28 LAB — GLUCOSE, CAPILLARY
Glucose-Capillary: 123 mg/dL — ABNORMAL HIGH (ref 70–99)
Glucose-Capillary: 165 mg/dL — ABNORMAL HIGH (ref 70–99)

## 2013-12-28 SURGERY — LAPAROSCOPIC CHOLECYSTECTOMY WITH INTRAOPERATIVE CHOLANGIOGRAM
Anesthesia: General | Site: Abdomen

## 2013-12-28 MED ORDER — KETOROLAC TROMETHAMINE 15 MG/ML IJ SOLN
INTRAMUSCULAR | Status: AC
  Filled 2013-12-28: qty 1

## 2013-12-28 MED ORDER — EPHEDRINE SULFATE 50 MG/ML IJ SOLN
INTRAMUSCULAR | Status: DC | PRN
  Administered 2013-12-28: 10 mg via INTRAVENOUS

## 2013-12-28 MED ORDER — MIDAZOLAM HCL 2 MG/2ML IJ SOLN
INTRAMUSCULAR | Status: AC
  Filled 2013-12-28: qty 2

## 2013-12-28 MED ORDER — CISATRACURIUM BESYLATE 20 MG/10ML IV SOLN
INTRAVENOUS | Status: AC
  Filled 2013-12-28: qty 10

## 2013-12-28 MED ORDER — FENTANYL CITRATE 0.05 MG/ML IJ SOLN
INTRAMUSCULAR | Status: AC
  Filled 2013-12-28: qty 2

## 2013-12-28 MED ORDER — PHENYLEPHRINE 40 MCG/ML (10ML) SYRINGE FOR IV PUSH (FOR BLOOD PRESSURE SUPPORT)
PREFILLED_SYRINGE | INTRAVENOUS | Status: AC
  Filled 2013-12-28: qty 10

## 2013-12-28 MED ORDER — LACTATED RINGERS IV SOLN
INTRAVENOUS | Status: DC | PRN
  Administered 2013-12-28: 1000 mL via INTRAVENOUS

## 2013-12-28 MED ORDER — BUPIVACAINE-EPINEPHRINE 0.5% -1:200000 IJ SOLN
INTRAMUSCULAR | Status: AC
  Filled 2013-12-28: qty 1

## 2013-12-28 MED ORDER — HYDROMORPHONE HCL PF 1 MG/ML IJ SOLN
INTRAMUSCULAR | Status: AC
  Filled 2013-12-28: qty 1

## 2013-12-28 MED ORDER — NEOSTIGMINE METHYLSULFATE 1 MG/ML IJ SOLN
INTRAMUSCULAR | Status: AC
  Filled 2013-12-28: qty 10

## 2013-12-28 MED ORDER — CISATRACURIUM BESYLATE (PF) 10 MG/5ML IV SOLN
INTRAVENOUS | Status: DC | PRN
  Administered 2013-12-28: 8 mg via INTRAVENOUS
  Administered 2013-12-28: 3 mg via INTRAVENOUS

## 2013-12-28 MED ORDER — OXYCODONE-ACETAMINOPHEN 5-325 MG PO TABS: 1.0000 | ORAL_TABLET | ORAL | Status: DC | PRN

## 2013-12-28 MED ORDER — ONDANSETRON HCL 4 MG/2ML IJ SOLN
INTRAMUSCULAR | Status: AC
  Filled 2013-12-28: qty 2

## 2013-12-28 MED ORDER — FENTANYL CITRATE 0.05 MG/ML IJ SOLN
INTRAMUSCULAR | Status: AC
  Filled 2013-12-28: qty 5

## 2013-12-28 MED ORDER — GLYCOPYRROLATE 0.2 MG/ML IJ SOLN
INTRAMUSCULAR | Status: AC
  Filled 2013-12-28: qty 3

## 2013-12-28 MED ORDER — LACTATED RINGERS IV SOLN
INTRAVENOUS | Status: DC | PRN
  Administered 2013-12-28 (×2): via INTRAVENOUS

## 2013-12-28 MED ORDER — ONDANSETRON HCL 4 MG/2ML IJ SOLN
INTRAMUSCULAR | Status: DC | PRN
  Administered 2013-12-28: 4 mg via INTRAVENOUS

## 2013-12-28 MED ORDER — FENTANYL CITRATE 0.05 MG/ML IJ SOLN
INTRAMUSCULAR | Status: DC | PRN
  Administered 2013-12-28 (×2): 50 ug via INTRAVENOUS
  Administered 2013-12-28: 100 ug via INTRAVENOUS

## 2013-12-28 MED ORDER — PROPOFOL 10 MG/ML IV BOLUS
INTRAVENOUS | Status: DC | PRN
  Administered 2013-12-28: 160 mg via INTRAVENOUS

## 2013-12-28 MED ORDER — SODIUM CHLORIDE 0.9 % IV SOLN
INTRAVENOUS | Status: DC | PRN
  Administered 2013-12-28: 12:00:00

## 2013-12-28 MED ORDER — DEXAMETHASONE SODIUM PHOSPHATE 10 MG/ML IJ SOLN
INTRAMUSCULAR | Status: DC | PRN
  Administered 2013-12-28: 10 mg via INTRAVENOUS

## 2013-12-28 MED ORDER — PROPOFOL 10 MG/ML IV BOLUS
INTRAVENOUS | Status: AC
  Filled 2013-12-28: qty 20

## 2013-12-28 MED ORDER — CEFOXITIN SODIUM 2 G IV SOLR
2.0000 g | INTRAVENOUS | Status: AC
  Administered 2013-12-28: 2 g via INTRAVENOUS
  Filled 2013-12-28: qty 2

## 2013-12-28 MED ORDER — NEOSTIGMINE METHYLSULFATE 1 MG/ML IJ SOLN
INTRAMUSCULAR | Status: DC | PRN
  Administered 2013-12-28: 4 mg via INTRAVENOUS

## 2013-12-28 MED ORDER — PHENYLEPHRINE HCL 10 MG/ML IJ SOLN
INTRAMUSCULAR | Status: DC | PRN
  Administered 2013-12-28: 80 ug via INTRAVENOUS

## 2013-12-28 MED ORDER — GLYCOPYRROLATE 0.2 MG/ML IJ SOLN
INTRAMUSCULAR | Status: DC | PRN
  Administered 2013-12-28: 0.6 mg via INTRAVENOUS

## 2013-12-28 MED ORDER — DEXTROSE 5 % IV SOLN
INTRAVENOUS | Status: AC
  Filled 2013-12-28 (×2): qty 1

## 2013-12-28 MED ORDER — MIDAZOLAM HCL 5 MG/5ML IJ SOLN
INTRAMUSCULAR | Status: DC | PRN
  Administered 2013-12-28: 2 mg via INTRAVENOUS

## 2013-12-28 MED ORDER — EPHEDRINE SULFATE 50 MG/ML IJ SOLN
INTRAMUSCULAR | Status: AC
  Filled 2013-12-28: qty 1

## 2013-12-28 MED ORDER — SODIUM CHLORIDE 0.9 % IJ SOLN
INTRAMUSCULAR | Status: AC
  Filled 2013-12-28: qty 10

## 2013-12-28 MED ORDER — BUPIVACAINE-EPINEPHRINE 0.25% -1:200000 IJ SOLN
INTRAMUSCULAR | Status: DC | PRN
  Administered 2013-12-28: 30 mL

## 2013-12-28 MED ORDER — DOCUSATE SODIUM 100 MG PO CAPS: 100.0000 mg | ORAL_CAPSULE | Freq: Two times a day (BID) | ORAL | Status: DC

## 2013-12-28 MED ORDER — DEXAMETHASONE SODIUM PHOSPHATE 10 MG/ML IJ SOLN
INTRAMUSCULAR | Status: AC
  Filled 2013-12-28: qty 1

## 2013-12-28 MED ORDER — PROMETHAZINE HCL 25 MG/ML IJ SOLN
6.2500 mg | INTRAMUSCULAR | Status: DC | PRN
  Administered 2013-12-28: 6.25 mg via INTRAVENOUS

## 2013-12-28 MED ORDER — BUPIVACAINE-EPINEPHRINE 0.25% -1:200000 IJ SOLN
INTRAMUSCULAR | Status: AC
  Filled 2013-12-28: qty 1

## 2013-12-28 MED ORDER — PROMETHAZINE HCL 25 MG/ML IJ SOLN
INTRAMUSCULAR | Status: AC
  Filled 2013-12-28: qty 1

## 2013-12-28 MED ORDER — FENTANYL CITRATE 0.05 MG/ML IJ SOLN
25.0000 ug | INTRAMUSCULAR | Status: DC | PRN
  Administered 2013-12-28 (×3): 50 ug via INTRAVENOUS

## 2013-12-28 MED ORDER — LACTATED RINGERS IV SOLN: INTRAVENOUS | Status: DC

## 2013-12-28 MED ORDER — KETOROLAC TROMETHAMINE 15 MG/ML IJ SOLN
15.0000 mg | Freq: Once | INTRAMUSCULAR | Status: AC
  Administered 2013-12-28: 15 mg via INTRAVENOUS

## 2013-12-28 MED ORDER — HYDROMORPHONE HCL PF 1 MG/ML IJ SOLN
0.2500 mg | INTRAMUSCULAR | Status: DC | PRN
  Administered 2013-12-28 (×4): 0.5 mg via INTRAVENOUS

## 2013-12-28 SURGICAL SUPPLY — 36 items
ADH SKN CLS APL DERMABOND .7 (GAUZE/BANDAGES/DRESSINGS) ×1
APPLIER CLIP 5 13 M/L LIGAMAX5 (MISCELLANEOUS) ×2
APR CLP MED LRG 5 ANG JAW (MISCELLANEOUS) ×1
BAG SPEC RTRVL LRG 6X4 10 (ENDOMECHANICALS) ×1
CABLE HIGH FREQUENCY MONO STRZ (ELECTRODE) ×2 IMPLANT
CANISTER SUCTION 2500CC (MISCELLANEOUS) ×2 IMPLANT
CHLORAPREP W/TINT 26ML (MISCELLANEOUS) ×2 IMPLANT
CLIP APPLIE 5 13 M/L LIGAMAX5 (MISCELLANEOUS) ×1 IMPLANT
COVER MAYO STAND STRL (DRAPES) ×1 IMPLANT
DERMABOND ADVANCED (GAUZE/BANDAGES/DRESSINGS) ×1
DERMABOND ADVANCED .7 DNX12 (GAUZE/BANDAGES/DRESSINGS) ×1 IMPLANT
DRAPE C-ARM 42X120 X-RAY (DRAPES) ×1 IMPLANT
DRAPE LAPAROSCOPIC ABDOMINAL (DRAPES) ×2 IMPLANT
DRAPE UTILITY XL STRL (DRAPES) ×2 IMPLANT
ELECT REM PT RETURN 9FT ADLT (ELECTROSURGICAL) ×2
ELECTRODE REM PT RTRN 9FT ADLT (ELECTROSURGICAL) ×1 IMPLANT
GLOVE BIO SURGEON STRL SZ 6.5 (GLOVE) ×2 IMPLANT
GLOVE BIOGEL PI IND STRL 7.0 (GLOVE) ×1 IMPLANT
GLOVE BIOGEL PI INDICATOR 7.0 (GLOVE) ×1
GLOVE SURG SS PI 8.5 STRL IVOR (GLOVE) ×2
GLOVE SURG SS PI 8.5 STRL STRW (GLOVE) IMPLANT
GOWN BRE IMP PREV XXLGXLNG (GOWN DISPOSABLE) ×2 IMPLANT
GOWN STRL REIN XL XLG (GOWN DISPOSABLE) ×4 IMPLANT
KIT BASIN OR (CUSTOM PROCEDURE TRAY) ×2 IMPLANT
POUCH SPECIMEN RETRIEVAL 10MM (ENDOMECHANICALS) ×2 IMPLANT
SET CHOLANGIOGRAPH MIX (MISCELLANEOUS) ×1 IMPLANT
SET IRRIG TUBING LAPAROSCOPIC (IRRIGATION / IRRIGATOR) ×2 IMPLANT
SOLUTION ANTI FOG 6CC (MISCELLANEOUS) ×2 IMPLANT
SUT VIC AB 4-0 PS2 18 (SUTURE) ×2 IMPLANT
TOWEL OR 17X26 10 PK STRL BLUE (TOWEL DISPOSABLE) ×2 IMPLANT
TOWEL OR NON WOVEN STRL DISP B (DISPOSABLE) ×2 IMPLANT
TRAY LAP CHOLE (CUSTOM PROCEDURE TRAY) ×2 IMPLANT
TROCAR BLADELESS OPT 5 75 (ENDOMECHANICALS) ×3 IMPLANT
TROCAR SLEEVE XCEL 5X75 (ENDOMECHANICALS) ×5 IMPLANT
TROCAR XCEL BLUNT TIP 100MML (ENDOMECHANICALS) ×2 IMPLANT
TUBING INSUFFLATION 10FT LAP (TUBING) ×2 IMPLANT

## 2013-12-28 NOTE — Op Note (Signed)
12/28/2013  11:57 AM  PATIENT:  Leslie Gentry  42 y.o. female  Patient Care Team: Pcp Not In System as PCP - General  PRE-OPERATIVE DIAGNOSIS:  Biliary colic  POST-OPERATIVE DIAGNOSIS:  Biliary colic  PROCEDURE:  Procedure(s): LAPAROSCOPIC CHOLECYSTECTOMY WITH INTRAOPERATIVE CHOLANGIOGRAM  SURGEON:  Surgeon(s): Romie Levee, MD  ASSISTANT: Derrell Lolling   ANESTHESIA:   general  EBL:  Total I/O In: 1000 [I.V.:1000] Out: -   DRAINS: none   SPECIMEN:  Source of Specimen:  gallbladder  DISPOSITION OF SPECIMEN:  PATHOLOGY  COUNTS:  YES  PLAN OF CARE: Discharge to home after PACU  PATIENT DISPOSITION:  PACU - hemodynamically stable.  INDICATION: 42 y.o. F with RUQ that has been persistent for the past few months.  Imaging shows some sludge in the gallbladder and LFTs were normal  The anatomy & physiology of hepatobiliary & pancreatic function was discussed.  The pathophysiology of gallbladder dysfunction was discussed.  Natural history risks without surgery was discussed.   I feel the risks of no intervention will lead to serious problems that outweigh the operative risks; therefore, I recommended cholecystectomy to remove the pathology.  I explained laparoscopic techniques with possible need for an open approach.  Probable cholangiogram to evaluate the bilary tract was explained as well.    Risks such as bleeding, infection, abscess, leak, injury to other organs, need for further treatment, heart attack, death, and other risks were discussed.  I noted a good likelihood this will help address the problem.  Possibility that this will not correct all abdominal symptoms was explained.  Goals of post-operative recovery were discussed as well.    OR FINDINGS: Massive liver, small hepatic ducts noted on IOC but otherwise normal appearing anatomy  DESCRIPTION:   The patient was identified & brought into the operating room. The patient was positioned supine with arms tucked. SCDs  were active during the entire case. The patient underwent general anesthesia without any difficulty.  The abdomen was prepped and draped in a sterile fashion. A Surgical Timeout was performed and confirmed our plan.  We positioned the patient in reverse [place] & right side up.  I placed a Hassan laparoscopic port through the umbilicus using open entry technique.  Entry was clean. There were no adhesions to the anterior abdominal wall supraumbilically.  We induced carbon dioxide insufflation. Camera inspection revealed no injury.   I proceeded to continue with laparoscopic technique. I placed a #5 port in mid subcostal region, another 5mm port in the right flank near the anterior axillary line, and a 5mm port in the left subxiphoid region obliquely within the falciform ligament.  I turned attention to the right upper quadrant.  The liver was large and floppy.  We were not able to retract this with our instruments, so an additional 5 mm port was placed for a liver retractor.  The gallbladder fundus was  Then elevated cephalad. I used cautery and blunt dissection to free the peritoneal coverings between the gallbladder and the liver on the posteriolateral and anteriomedial walls.   I used careful blunt and cautery dissection with a maryland dissector to help get a good critical view of the cystic artery and cystic duct. I did further dissection to free a few centimeters of the  gallbladder off the liver bed to get a good critical view of the infundibulum and cystic duct. I mobilized the cystic artery.  I skeletonized the cystic duct.  After getting a good 360 view, I decided to perform a  cholangiogram.  I placed a clip on the infundibulum.   I did a partial cystic duct-otomy and ensured patency. I placed a 5 French balloon cholangiocatheter through a puncture site at the right subcostal ridge of the abdominal wall and directed it into the cystic duct.  We ran a cholangiogram with dilute radio-opaque  contrast and continuous fluoroscopy.  Contrast flowed from a side branch consistent with cystic duct cannulization. Contrast flowed up the common hepatic duct into the right and left intrahepatic chains out to secondary radicals. Contrast flowed down the common bile duct easily across the normal ampulla into the duodenum.  This was consistent with a normal cholangiogram.  I removed the cholangiocatheter.  I placed clips on the cystic duct x3.  I completed cystic duct transection.   I placed clips on the cystic artery x3 with 2 proximally.  I ligated the cystic artery using scissors. I freed the gallbladder from its remaining attachments to the liver. I ensured hemostasis on the gallbladder fossa of the liver and elsewhere. I inspected the rest of the abdomen & detected no injury nor bleeding elsewhere.  I irrigated the RUQ with normal saline.  I removed the gallbladder through the umbilical port site.  I closed the umbilical fascia using 0 Vicryl stitches.   I closed the skin using 4-0 vicryl stitch.  Sterile dressings were applied. The patient was extubated & arrived in the PACU in stable condition.  I had discussed postoperative care with the patient in the holding area.   I will discuss  operative findings and postoperative goals / instructions with the patient's family.  Instructions are written in the chart as well.

## 2013-12-28 NOTE — H&P (Signed)
HPI  Leslie Gentry is a 42 y.o. female. We're asked to see the patient in consultation by Dr. Fayrene Fearing to evaluate her for gallstones. The patient is a 42 year old black female who has been complaining of stomach pains for the last few months. The pains are located on the right side. She has no nausea or vomiting with it but has not eaten because of the pain. The pain has been fairly constant. She went to the emergency department where a CT scan was performed that suggested there may be some stones or sludge in the gallbladder. Her liver functions were normal.  HPI  Past Medical History   Diagnosis  Date   .  Hypertension    .  Diabetes mellitus     Past Surgical History   Procedure  Laterality  Date   .  Abdominal hysterectomy     .  Appendectomy     .  Tonsillectomy     .  Cesarean section     No family history on file.  Social History  History   Substance Use Topics   .  Smoking status:  Never Smoker   .  Smokeless tobacco:  Not on file   .  Alcohol Use:  No    Allergies   Allergen  Reactions   .  Erythromycin  Hives    Current Outpatient Prescriptions   Medication  Sig  Dispense  Refill   .  amLODipine (NORVASC) 10 MG tablet  Take 0.5 tablets (5 mg total) by mouth daily.  30 tablet  0   .  gabapentin (NEURONTIN) 300 MG capsule  Take 300 mg by mouth 3 (three) times daily.     Marland Kitchen  HYDROcodone-acetaminophen (NORCO/VICODIN) 5-325 MG per tablet  Take 2 tablets by mouth every 4 (four) hours as needed.  10 tablet  0   .  ibuprofen (ADVIL,MOTRIN) 200 MG tablet  Take 400 mg by mouth every 6 (six) hours as needed. Patient used this medication for back pain.     Marland Kitchen  lisinopril (PRINIVIL,ZESTRIL) 20 MG tablet  Take 20 mg by mouth daily.     Marland Kitchen  losartan-hydrochlorothiazide (HYZAAR) 100-25 MG per tablet  Take 1 tablet by mouth daily.     .  metFORMIN (GLUCOPHAGE) 1000 MG tablet  Take 1,000 mg by mouth 2 (two) times daily with a meal.      No current facility-administered medications for  this visit.   Review of Systems  Review of Systems  Constitutional: Negative.  HENT: Negative.  Eyes: Negative.  Respiratory: Negative.  Cardiovascular: Negative.  Gastrointestinal: Positive for abdominal pain.  Endocrine: Negative.  Genitourinary: Negative.  Musculoskeletal: Negative.  Skin: Negative.  Allergic/Immunologic: Negative.  Neurological: Negative.  Hematological: Negative.  Psychiatric/Behavioral: Negative.    Physical Exam  Filed Vitals:   12/28/13 0746  BP: 169/98  Pulse: 58  Temp: 97.9 F (36.6 C)  Resp: 16    Physical Exam  Constitutional: She is oriented to person, place, and time. She appears well-developed and well-nourished.  HENT:  Head: Normocephalic and atraumatic.  Eyes: Conjunctivae and EOM are normal. Pupils are equal, round, and reactive to light.  Neck: Normal range of motion. Neck supple.  Cardiovascular: Normal rate, regular rhythm and normal heart sounds.  Pulmonary/Chest: Effort normal and breath sounds normal.  Abdominal: Soft. Bowel sounds are normal.  There is moderate right-sided tenderness but no guarding or peritonitis. There is no palpable mass.  Musculoskeletal: Normal range of motion.  Neurological:  She is alert and oriented to person, place, and time.  Skin: Skin is warm and dry.  Psychiatric: She has a normal mood and affect. Her behavior is normal.  Data Reviewed  RUQ [place]: Gallbladder: Normally distended without stones or wall thickening. No pericholecystic fluid or sonographic Murphy sign. Common bile duct: Diameter: Normal caliber 3 mm diameter  Abd CT: IMPRESSION: Slight thickening peri umbilical region unchanged from prior examination without evidence of drainable abscess. No connection to underlying bowel. Right renal 3.8 cm cyst. Elongated fatty liver spanning over 22 cm. Radiopaque material within the gallbladder may represent sludge or small stones.  No extra luminal bowel inflammatory process, free fluid or free  air.  Assessment  The patient appears to have either some sludge or some very small stones in the gallbladder. It is certainly possible that this could be causing her pain although I cannot guarantee it. Because there is a chance that sludge in the gallbladder could cause her pain I think it would be reasonable to remove the gallbladder and take it out of the equation. I have discussed with her in detail the risks and benefits of the operation and digits as well as some of the technical aspects and she understands and wishes to proceed. She understands that there is no guarantee that removing the gallbladder will fix her pain.   The anatomy & physiology of hepatobiliary & pancreatic function was discussed.  The pathophysiology of gallbladder dysfunction was discussed.  Natural history risks without surgery was discussed.   I feel the risks of no intervention will lead to serious problems that outweigh the operative risks; therefore, I recommended cholecystectomy to remove the pathology.  I explained laparoscopic techniques with possible need for an open approach.  Probable cholangiogram to evaluate the bilary tract was explained as well.    Risks such as bleeding, infection, abscess, leak, injury to other organs, need for further treatment, heart attack, death, and other risks were discussed.  I noted a good likelihood this will help address the problem.  Possibility that this will not correct all abdominal symptoms was explained.  Goals of post-operative recovery were discussed as well.  We will work to minimize complications.  An educational handout further explaining the pathology and treatment options was given as well.  Questions were answered.  The patient expresses understanding & wishes to proceed with surgery.  Plan  Plan for laparoscopic cholecystectomy with intraoperative cholangiogram

## 2013-12-28 NOTE — Transfer of Care (Signed)
Immediate Anesthesia Transfer of Care Note  Patient: Leslie Gentry  Procedure(s) Performed: Procedure(s): LAPAROSCOPIC CHOLECYSTECTOMY WITH INTRAOPERATIVE CHOLANGIOGRAM (N/A)  Patient Location: PACU  Anesthesia Type:General  Level of Consciousness: awake, sedated and patient cooperative  Airway & Oxygen Therapy: Patient Spontanous Breathing and Patient connected to face mask oxygen  Post-op Assessment: Report given to PACU RN and Post -op Vital signs reviewed and stable  Post vital signs: Reviewed and stable  Complications: No apparent anesthesia complications

## 2013-12-28 NOTE — Anesthesia Postprocedure Evaluation (Signed)
  Anesthesia Post-op Note  Patient: Leslie Gentry  Procedure(s) Performed: Procedure(s) (LRB): LAPAROSCOPIC CHOLECYSTECTOMY WITH INTRAOPERATIVE CHOLANGIOGRAM (N/A)  Patient Location: PACU  Anesthesia Type: General  Level of Consciousness: awake and alert   Airway and Oxygen Therapy: Patient Spontanous Breathing  Post-op Pain: mild  Post-op Assessment: Post-op Vital signs reviewed, Patient's Cardiovascular Status Stable, Respiratory Function Stable, Patent Airway and No signs of Nausea or vomiting  Last Vitals:  Filed Vitals:   12/28/13 1450  BP: 124/76  Pulse: 84  Temp: 36.3 C  Resp: 22    Post-op Vital Signs: stable   Complications: No apparent anesthesia complications

## 2013-12-28 NOTE — Anesthesia Preprocedure Evaluation (Signed)
Anesthesia Evaluation  Patient identified by MRN, date of birth, ID band Patient awake    Reviewed: Allergy & Precautions, H&P , NPO status , Patient's Chart, lab work & pertinent test results  Airway Mallampati: II TM Distance: >3 FB Neck ROM: Full    Dental no notable dental hx.    Pulmonary neg pulmonary ROS,  breath sounds clear to auscultation  Pulmonary exam normal       Cardiovascular Exercise Tolerance: Good hypertension, Pt. on medications negative cardio ROS  Rhythm:Regular Rate:Normal     Neuro/Psych  Headaches, negative psych ROS   GI/Hepatic Neg liver ROS, hiatal hernia,   Endo/Other  diabetes, Type 2, Oral Hypoglycemic Agents  Renal/GU negative Renal ROS  negative genitourinary   Musculoskeletal negative musculoskeletal ROS (+)   Abdominal (+) + obese,   Peds negative pediatric ROS (+)  Hematology  (+) anemia ,   Anesthesia Other Findings   Reproductive/Obstetrics negative OB ROS                           Anesthesia Physical Anesthesia Plan  ASA: II  Anesthesia Plan: General   Post-op Pain Management:    Induction: Intravenous  Airway Management Planned: Oral ETT  Additional Equipment:   Intra-op Plan:   Post-operative Plan: Extubation in OR  Informed Consent: I have reviewed the patients History and Physical, chart, labs and discussed the procedure including the risks, benefits and alternatives for the proposed anesthesia with the patient or authorized representative who has indicated his/her understanding and acceptance.   Dental advisory given  Plan Discussed with: CRNA  Anesthesia Plan Comments:         Anesthesia Quick Evaluation

## 2013-12-29 ENCOUNTER — Encounter (HOSPITAL_COMMUNITY): Payer: Self-pay | Admitting: General Surgery

## 2014-01-03 ENCOUNTER — Telehealth (INDEPENDENT_AMBULATORY_CARE_PROVIDER_SITE_OTHER): Payer: Self-pay | Admitting: *Deleted

## 2014-01-03 NOTE — Telephone Encounter (Signed)
I attempted to call pt however her number is not working at this time.  I was calling to notify her of her postop appt with Dr. Maisie Fushomas on 01/12/14 with an arrival time of 10:00am.

## 2014-01-11 ENCOUNTER — Telehealth (INDEPENDENT_AMBULATORY_CARE_PROVIDER_SITE_OTHER): Payer: Self-pay

## 2014-01-11 NOTE — Telephone Encounter (Signed)
Patient is stating she has a small opening at incision site with some pus drainage, refused urg appt for today states she wait until appt tomorrow with DR. Thomas.Denies temp.  Advised to to keep area clean and dry and to call if she starts to have temp 100.3 gr.

## 2014-01-11 NOTE — Telephone Encounter (Signed)
This note needs to go to Ivory BroadSara Glaspey as well so she will know about her pt for tomorrow.

## 2014-01-12 ENCOUNTER — Ambulatory Visit (INDEPENDENT_AMBULATORY_CARE_PROVIDER_SITE_OTHER): Payer: 59 | Admitting: General Surgery

## 2014-01-12 ENCOUNTER — Encounter (INDEPENDENT_AMBULATORY_CARE_PROVIDER_SITE_OTHER): Payer: Self-pay | Admitting: General Surgery

## 2014-01-12 VITALS — BP 140/98 | HR 76 | Temp 97.6°F | Resp 14 | Ht 65.0 in | Wt 221.2 lb

## 2014-01-12 DIAGNOSIS — Z9889 Other specified postprocedural states: Secondary | ICD-10-CM

## 2014-01-12 NOTE — Patient Instructions (Signed)
Ok to drive once off pain medications.  Ok for light activity.  No heavy lifting for 8 weeks after surgery

## 2014-01-12 NOTE — Progress Notes (Signed)
Leslie Gentry is a 43 y.o. female who is status post a lap cholecystectomy on 12/30.  She is doing well. The abdominal pain that she had preoperatively is gone. Her surgical pain is getting better. She is almost off her narcotics. She is not having diarrhea. She is having drainage from one of her 5 mm port sites. This is mostly clear in nature.  Objective: Filed Vitals:   01/12/14 1040  BP: 140/98  Pulse: 76  Temp: 97.6 F (36.4 Leslie)  Resp: 14    General appearance: alert and cooperative GI: normal findings: soft, non-tender  Incision: healing well, Her middle 5 mm port site is draining clear fluid. There is no erythema or purulence noted. The remainder of her midline wounds appear to be healing well    Assessment: s/p  Lap cholecystectomy: Her pathology showed chronic cholecystitis only  Plan: She seems to be doing well after surgery. She'll keep a 5 mm port site that is draining covered with a bandage until the drainage has stopped. I recommended that she continue with no heavy lifting for 8 weeks after surgery. I will see her back on as needed basis.    Leslie Gentry.Leslie Gentry Leslie Mitsuru Dault, MD [facility], [place] [phone]   01/12/2014 10:56 AM

## 2014-05-02 ENCOUNTER — Encounter (HOSPITAL_BASED_OUTPATIENT_CLINIC_OR_DEPARTMENT_OTHER): Payer: Self-pay | Admitting: Emergency Medicine

## 2014-05-02 ENCOUNTER — Emergency Department (HOSPITAL_BASED_OUTPATIENT_CLINIC_OR_DEPARTMENT_OTHER)
Admission: EM | Admit: 2014-05-02 | Discharge: 2014-05-02 | Disposition: A | Discharge status: Home / Self Care | Payer: 59 | Attending: Emergency Medicine | Admitting: Emergency Medicine

## 2014-05-02 DIAGNOSIS — G43909 Migraine, unspecified, not intractable, without status migrainosus: Secondary | ICD-10-CM | POA: Insufficient documentation

## 2014-05-02 DIAGNOSIS — Z862 Personal history of diseases of the blood and blood-forming organs and certain disorders involving the immune mechanism: Secondary | ICD-10-CM | POA: Insufficient documentation

## 2014-05-02 DIAGNOSIS — M545 Low back pain, unspecified: Secondary | ICD-10-CM | POA: Insufficient documentation

## 2014-05-02 DIAGNOSIS — R51 Headache: Secondary | ICD-10-CM

## 2014-05-02 DIAGNOSIS — E119 Type 2 diabetes mellitus without complications: Secondary | ICD-10-CM | POA: Insufficient documentation

## 2014-05-02 DIAGNOSIS — Z79899 Other long term (current) drug therapy: Secondary | ICD-10-CM | POA: Insufficient documentation

## 2014-05-02 DIAGNOSIS — I10 Essential (primary) hypertension: Secondary | ICD-10-CM | POA: Insufficient documentation

## 2014-05-02 DIAGNOSIS — R519 Headache, unspecified: Secondary | ICD-10-CM

## 2014-05-02 DIAGNOSIS — Z3202 Encounter for pregnancy test, result negative: Secondary | ICD-10-CM | POA: Insufficient documentation

## 2014-05-02 DIAGNOSIS — Z8719 Personal history of other diseases of the digestive system: Secondary | ICD-10-CM | POA: Insufficient documentation

## 2014-05-02 HISTORY — DX: Migraine, unspecified, not intractable, without status migrainosus: G43.909

## 2014-05-02 LAB — CBC
HCT: 42.5 % (ref 36.0–46.0)
HEMOGLOBIN: 14.4 g/dL (ref 12.0–15.0)
MCH: 29.4 pg (ref 26.0–34.0)
MCHC: 33.9 g/dL (ref 30.0–36.0)
MCV: 86.7 fL (ref 78.0–100.0)
PLATELETS: 273 10*3/uL (ref 150–400)
RBC: 4.9 MIL/uL (ref 3.87–5.11)
RDW: 13.2 % (ref 11.5–15.5)
WBC: 4.2 10*3/uL (ref 4.0–10.5)

## 2014-05-02 LAB — URINALYSIS, ROUTINE W REFLEX MICROSCOPIC
Glucose, UA: NEGATIVE mg/dL
Hgb urine dipstick: NEGATIVE
Ketones, ur: 15 mg/dL — AB
Leukocytes, UA: NEGATIVE
Nitrite: NEGATIVE
PH: 5.5 (ref 5.0–8.0)
Protein, ur: NEGATIVE mg/dL
SPECIFIC GRAVITY, URINE: 1.04 — AB (ref 1.005–1.030)
Urobilinogen, UA: 0.2 mg/dL (ref 0.0–1.0)

## 2014-05-02 LAB — BASIC METABOLIC PANEL
BUN: 11 mg/dL (ref 6–23)
CALCIUM: 9.6 mg/dL (ref 8.4–10.5)
CHLORIDE: 102 meq/L (ref 96–112)
CO2: 25 mEq/L (ref 19–32)
Creatinine, Ser: 0.8 mg/dL (ref 0.50–1.10)
GFR calc Af Amer: >90 mL/min (ref >90)
GFR calc non Af Amer: >90 mL/min (ref >90)
GLUCOSE: 137 mg/dL — AB (ref 70–99)
Potassium: 3.5 mEq/L — ABNORMAL LOW (ref 3.7–5.3)
SODIUM: 142 meq/L (ref 137–147)

## 2014-05-02 LAB — PREGNANCY, URINE: Preg Test, Ur: NEGATIVE

## 2014-05-02 MED ORDER — DIPHENHYDRAMINE HCL 50 MG/ML IJ SOLN
25.0000 mg | Freq: Once | INTRAMUSCULAR | Status: AC
  Administered 2014-05-02: 25 mg via INTRAVENOUS
  Filled 2014-05-02: qty 1

## 2014-05-02 MED ORDER — ONDANSETRON 4 MG PO TBDP: 4.0000 mg | ORAL_TABLET | Freq: Three times a day (TID) | ORAL | Status: DC | PRN

## 2014-05-02 MED ORDER — METOCLOPRAMIDE HCL 5 MG/ML IJ SOLN
10.0000 mg | Freq: Once | INTRAMUSCULAR | Status: AC
  Administered 2014-05-02: 10 mg via INTRAVENOUS
  Filled 2014-05-02: qty 2

## 2014-05-02 MED ORDER — KETOROLAC TROMETHAMINE 30 MG/ML IJ SOLN
30.0000 mg | Freq: Once | INTRAMUSCULAR | Status: AC
  Administered 2014-05-02: 30 mg via INTRAVENOUS
  Filled 2014-05-02: qty 1

## 2014-05-02 MED ORDER — SODIUM CHLORIDE 0.9 % IV BOLUS (SEPSIS)
1000.0000 mL | Freq: Once | INTRAVENOUS | Status: AC
  Administered 2014-05-02: 1000 mL via INTRAVENOUS

## 2014-05-02 MED ORDER — ONDANSETRON HCL 4 MG/2ML IJ SOLN
4.0000 mg | Freq: Once | INTRAMUSCULAR | Status: AC
  Administered 2014-05-02: 4 mg via INTRAVENOUS
  Filled 2014-05-02: qty 2

## 2014-05-02 NOTE — ED Notes (Signed)
Pt watching tv in nad, states her ha remains 10/10.

## 2014-05-02 NOTE — ED Notes (Signed)
Pt reports migraine ha with radiation down back and legs for 4 days.  Photophobia, n/v.

## 2014-05-02 NOTE — ED Provider Notes (Signed)
CSN: [id number]     Arrival date & time 05/02/14  1628 History   First MD Initiated Contact with Patient 05/02/14 1807     Chief Complaint  Patient presents with  . Migraine     (Consider location/radiation/quality/duration/timing/severity/associated sxs/prior Treatment) HPI Comments: The patient is a 43 year old female with past medical history of hypertension, migraines, DM, presenting to the emergency department with a chief complaint of headache for 4 days. The patient reports a gradual headache described as dull, throbbing, constant, nonradiating, worse in by walking. She reports associated photophobia and mild photophobia.  Denies visual changes, fever, chills, neck pain.  She reports lower back pain since yesterday.  Reports taking tylenol without resolution of symptoms.  Patient is a 43 y.o. female presenting with migraines. The history is provided by the patient. No language interpreter was used.  Migraine Associated symptoms include headaches, nausea and vomiting. Pertinent negatives include no abdominal pain, chills, fever, neck pain, numbness, rash or weakness.    Past Medical History  Diagnosis Date  . Hypertension   . Diabetes mellitus   . Gallstones   . H/O hiatal hernia   . Headache(784.0)     hx of migraines   . Anemia   . Migraine    Past Surgical History  Procedure Laterality Date  . Abdominal hysterectomy    . Appendectomy    . Tonsillectomy    . Cesarean section    . Cholecystectomy N/A 12/28/2013    Procedure: LAPAROSCOPIC CHOLECYSTECTOMY WITH INTRAOPERATIVE CHOLANGIOGRAM;  Surgeon: Romie LeveeAlicia Thomas, MD;  Location: [facility];  Service: General;  Laterality: N/A;   No family history on file. History  Substance Use Topics  . Smoking status: Never Smoker   . Smokeless tobacco: Never Used  . Alcohol Use: No   OB History   Grav Para Term Preterm Abortions TAB SAB Ect Mult Living                 Review of Systems  Constitutional: Negative for fever and  chills.  Eyes: Positive for photophobia. Negative for pain and visual disturbance.  Gastrointestinal: Positive for nausea and vomiting. Negative for abdominal pain, diarrhea and constipation.  Genitourinary: Negative for dysuria.  Musculoskeletal: Positive for back pain. Negative for gait problem, neck pain and neck stiffness.  Skin: Negative for rash.  Neurological: Positive for headaches. Negative for syncope, facial asymmetry, weakness and numbness.      Allergies  Chlorhexidine and Erythromycin  Home Medications   Prior to Admission medications   Medication Sig Start Date End Date Taking? Authorizing Provider  amLODipine (NORVASC) 10 MG tablet Take 10 mg by mouth daily.    Historical Provider, MD  gabapentin (NEURONTIN) 300 MG capsule Take 300 mg by mouth 3 (three) times daily.    Historical Provider, MD  ibuprofen (ADVIL,MOTRIN) 200 MG tablet Take 200 mg by mouth as needed for mild pain or moderate pain.     Historical Provider, MD  metFORMIN (GLUCOPHAGE) 1000 MG tablet Take 1,000 mg by mouth 2 (two) times daily with a meal.    Historical Provider, MD  oxyCODONE-acetaminophen (ROXICET) 5-325 MG per tablet Take 1-2 tablets by mouth every 4 (four) hours as needed for severe pain. 12/28/13   Romie LeveeAlicia Thomas, MD   BP 143/86  Pulse 78  Temp(Src) 98.9 F (37.2 C) (Oral)  Resp 18  Ht 5\' 5"  (1.651 m)  Wt 220 lb (99.791 kg)  BMI 36.61 kg/m2  SpO2 100% Physical Exam  Nursing note and vitals reviewed.  Constitutional: She is oriented to person, place, and time. She appears well-developed and well-nourished. No distress.  Wearing sunglasses in ED.  HENT:  Head: Normocephalic and atraumatic.  Mouth/Throat: No oropharyngeal exudate.  Eyes: Conjunctivae and EOM are normal. Pupils are equal, round, and reactive to light. Right eye exhibits no discharge. Left eye exhibits no discharge. No scleral icterus.  Neck: Neck supple.  Cardiovascular: Normal rate and regular rhythm.     Pulmonary/Chest: Effort normal. No respiratory distress. She has no wheezes. She has no rales.  Abdominal: Soft. There is no tenderness. There is no rebound and no guarding.  Musculoskeletal: Normal range of motion.       Lumbar back: She exhibits tenderness. She exhibits no swelling, no edema, no deformity and no laceration.       Back:  No midline C-spine, T-spine, or L-spine tenderness with no step-offs, crepitus, or deformities noted.  Neurological: She is alert and oriented to person, place, and time.  Speech is clear and goal oriented, follows commands Cranial nerves III - XII grossly intact, no facial droop Normal strength in upper and lower extremities bilaterally, strong and equal grip strength Sensation normal to light  touch Moves all 4 extremities without ataxia, coordination intact Normal finger to nose and rapid alternating movements No pronator drift  Skin: Skin is warm and dry. No rash noted. She is not diaphoretic.  Psychiatric: She has a normal mood and affect. Her behavior is normal. Thought content normal.    ED Course  Procedures (including critical care time) Labs Review Labs Reviewed  URINALYSIS, ROUTINE W REFLEX MICROSCOPIC - Abnormal; Notable for the following:    Color, Urine AMBER (*)    Specific Gravity, Urine 1.040 (*)    Bilirubin Urine SMALL (*)    Ketones, ur 15 (*)    All other components within normal limits  BASIC METABOLIC PANEL - Abnormal; Notable for the following:    Potassium 3.5 (*)    Glucose, Bld 137 (*)    All other components within normal limits  PREGNANCY, URINE  CBC    Imaging Review No results found.   EKG Interpretation None      MDM   Final diagnoses:  Headache  Low back pain   Presentation is like pts typical HA and non concerning for [facility], ICH, Meningitis, or temporal arteritis. Pt is afebrile with no focal neuro deficits, nuchal rigidity, or change in vision. Re-eval pt reports persistent discomfort. Discussed  work up with the patient. Pt is to follow up with PCP to discuss prophylactic medication. Pt verbalizes understanding and is agreeable with plan to dc.   Meds given in ED:  Medications  sodium chloride 0.9 % bolus 1,000 mL (0 mLs Intravenous Stopped 05/02/14 2000)  ketorolac (TORADOL) 30 MG/ML injection 30 mg (30 mg Intravenous Given 05/02/14 1818)  ondansetron (ZOFRAN) injection 4 mg (4 mg Intravenous Given 05/02/14 1847)  metoCLOPramide (REGLAN) injection 10 mg (10 mg Intravenous Given 05/02/14 1938)  diphenhydrAMINE (BENADRYL) injection 25 mg (25 mg Intravenous Given 05/02/14 1939)    Discharge Medication List as of 05/02/2014  8:29 PM    START taking these medications   Details  ondansetron (ZOFRAN ODT) 4 MG disintegrating tablet Take 1 tablet (4 mg total) by mouth every 8 (eight) hours as needed for nausea or vomiting., Starting 05/02/2014, Until Discontinued, Print            Clabe SealLauren M Mayerli Kirst, PA-C 05/04/14 1440

## 2014-05-02 NOTE — ED Notes (Signed)
Pt asks her mother to turn on tv, tv volume is so loud I cannot hear pt answers to my questions. When asked to turn off tv, pt states "we need to see the news".

## 2014-05-02 NOTE — Discharge Instructions (Signed)
Call for a follow up appointment with a Family or Primary Care Provider.  °Return if Symptoms worsen.   °Take medication as prescribed.  ° °

## 2014-05-12 NOTE — ED Provider Notes (Signed)
Medical screening examination/treatment/procedure(s) were performed by non-physician practitioner and as supervising physician I was immediately available for consultation/collaboration.   EKG Interpretation None        Shaune Westfall W. Devanny Palecek, MD 05/12/14 1618 

## 2014-10-17 ENCOUNTER — Emergency Department (HOSPITAL_BASED_OUTPATIENT_CLINIC_OR_DEPARTMENT_OTHER)
Admission: EM | Admit: 2014-10-17 | Discharge: 2014-10-18 | Disposition: A | Discharge status: Home / Self Care | Payer: 59 | Attending: Emergency Medicine | Admitting: Emergency Medicine

## 2014-10-17 ENCOUNTER — Encounter (HOSPITAL_BASED_OUTPATIENT_CLINIC_OR_DEPARTMENT_OTHER): Payer: Self-pay | Admitting: Emergency Medicine

## 2014-10-17 DIAGNOSIS — G43809 Other migraine, not intractable, without status migrainosus: Secondary | ICD-10-CM

## 2014-10-17 DIAGNOSIS — Z8719 Personal history of other diseases of the digestive system: Secondary | ICD-10-CM | POA: Insufficient documentation

## 2014-10-17 DIAGNOSIS — Z79899 Other long term (current) drug therapy: Secondary | ICD-10-CM | POA: Insufficient documentation

## 2014-10-17 DIAGNOSIS — Z862 Personal history of diseases of the blood and blood-forming organs and certain disorders involving the immune mechanism: Secondary | ICD-10-CM | POA: Diagnosis not present

## 2014-10-17 DIAGNOSIS — E119 Type 2 diabetes mellitus without complications: Secondary | ICD-10-CM | POA: Diagnosis not present

## 2014-10-17 DIAGNOSIS — R51 Headache: Secondary | ICD-10-CM | POA: Diagnosis present

## 2014-10-17 DIAGNOSIS — I10 Essential (primary) hypertension: Secondary | ICD-10-CM | POA: Insufficient documentation

## 2014-10-17 DIAGNOSIS — R0981 Nasal congestion: Secondary | ICD-10-CM

## 2014-10-17 NOTE — ED Provider Notes (Signed)
CSN: [id number]     Arrival date & time 10/17/14  2302 History  This chart was scribed for Loren Raceravid Demetrious Rainford, MD by Gwenyth Oberatherine Macek, ED Scribe. This patient was seen in room MH07/MH07 and the patient's care was started at 11:55 PM.    Chief Complaint  Patient presents with  . Headache   The history is provided by the patient. No language interpreter was used.   HPI Comments: Leslie Gentry is a 43 y.o. female who presents to the Emergency Department complaining of constant headache that started 8 days ago and is now radiating to her back. She also states that she had episodes of vomiting due to pain 2 days ago. Pt states that the pain becomes worse with light. She denies ear pain, visual disturbances, neck pain, and nausea as associated symptoms. Pt has history of migraines, but states that they typically do not last this long. She has no history of seasonal allergies. Pt has not seen a neurologist recently for evaluation. No fever chills. No neck stiffness. No focal weakness or numbness. Past Medical History  Diagnosis Date  . Hypertension   . Diabetes mellitus   . Gallstones   . H/O hiatal hernia   . Headache(784.0)     hx of migraines   . Anemia   . Migraine    Past Surgical History  Procedure Laterality Date  . Abdominal hysterectomy    . Appendectomy    . Tonsillectomy    . Cesarean section    . Cholecystectomy N/A 12/28/2013    Procedure: LAPAROSCOPIC CHOLECYSTECTOMY WITH INTRAOPERATIVE CHOLANGIOGRAM;  Surgeon: Romie LeveeAlicia Thomas, MD;  Location: [facility];  Service: General;  Laterality: N/A;   No family history on file. History  Substance Use Topics  . Smoking status: Never Smoker   . Smokeless tobacco: Never Used  . Alcohol Use: No   OB History   Grav Para Term Preterm Abortions TAB SAB Ect Mult Living                 Review of Systems  Constitutional: Negative for fever and chills.  HENT: Positive for sinus pressure. Negative for congestion, ear pain and sore throat.    Eyes: Positive for photophobia. Negative for visual disturbance.  Respiratory: Negative for chest tightness and shortness of breath.   Cardiovascular: Negative for chest pain.  Gastrointestinal: Positive for nausea and vomiting. Negative for abdominal pain.  Musculoskeletal: Negative for back pain, neck pain and neck stiffness.  Skin: Negative for rash and wound.  Neurological: Positive for headaches. Negative for dizziness, syncope, weakness, light-headedness and numbness.  All other systems reviewed and are negative.     Allergies  Chlorhexidine and Erythromycin  Home Medications   Prior to Admission medications   Medication Sig Start Date End Date Taking? Authorizing Provider  amLODipine (NORVASC) 10 MG tablet Take 10 mg by mouth daily.    Historical Provider, MD  gabapentin (NEURONTIN) 300 MG capsule Take 300 mg by mouth 3 (three) times daily.    Historical Provider, MD  ibuprofen (ADVIL,MOTRIN) 200 MG tablet Take 200 mg by mouth as needed for mild pain or moderate pain.     Historical Provider, MD  metFORMIN (GLUCOPHAGE) 1000 MG tablet Take 1,000 mg by mouth 2 (two) times daily with a meal.    Historical Provider, MD  ondansetron (ZOFRAN ODT) 4 MG disintegrating tablet Take 1 tablet (4 mg total) by mouth every 8 (eight) hours as needed for nausea or vomiting. 05/02/14   Mellody DrownLauren Parker,  PA-C  oxyCODONE-acetaminophen (ROXICET) 5-325 MG per tablet Take 1-2 tablets by mouth every 4 (four) hours as needed for severe pain. 12/28/13   Romie LeveeAlicia Thomas, MD   BP 141/91  Pulse 75  Temp(Src) 98.3 F (36.8 C) (Oral)  Resp 20  Ht 5\' 5"  (1.651 m)  Wt 219 lb (99.338 kg)  BMI 36.44 kg/m2  SpO2 100% Physical Exam  Nursing note and vitals reviewed. Constitutional: She is oriented to person, place, and time. She appears well-developed and well-nourished. No distress.  HENT:  Head: Normocephalic and atraumatic.  Mouth/Throat: Oropharynx is clear and moist. No oropharyngeal exudate.  Bilateral  nasal mucosal edema. Tenderness to percussion over the right frontal sinus.  Eyes: EOM are normal. Pupils are equal, round, and reactive to light.  Neck: Normal range of motion. Neck supple.  No meningismus. No lymphadenopathy.  Cardiovascular: Normal rate and regular rhythm.   Pulmonary/Chest: Effort normal and breath sounds normal. No respiratory distress. She has no wheezes. She has no rales.  Abdominal: Soft. Bowel sounds are normal. She exhibits no distension and no mass. There is no tenderness. There is no rebound and no guarding.  Musculoskeletal: Normal range of motion. She exhibits no edema and no tenderness.  Neurological: She is alert and oriented to person, place, and time.  Patient is alert and oriented x3 with clear, goal oriented speech. Patient has 5/5 motor in all extremities. Sensation is intact to light touch. Patient has a normal gait and walks without assistance.   Skin: Skin is warm and dry. No rash noted. No erythema.  Psychiatric: She has a normal mood and affect. Her behavior is normal.     ED Course  Procedures (including critical care time) DIAGNOSTIC STUDIES: Oxygen Saturation is 100% on RA, normal by my interpretation.    COORDINATION OF CARE: 12:02 AM Discussed treatment plan with pt at bedside and pt agreed to plan.   Labs Review Labs Reviewed - No data to display  Imaging Review No results found.   EKG Interpretation None      MDM   Final diagnoses:  None    I personally performed the services described in this documentation, which was scribed in my presence. The recorded information has been reviewed and is accurate.  Patient likely has sinus disease which is exacerbating her migraines. We'll treat symptomatically in the emergency department and reevaluate. No red flag signs or symptoms. Headache as gradual onset. Normal neurologic exam.  Headache is improved. Neurologic exam remains normal. Return precautions given.  Loren Raceravid Lamija Besse,  MD 10/18/14 [phone]

## 2014-10-17 NOTE — ED Notes (Signed)
Pt c/o HA x8 days. She sts the pain is now radiating down into her back.

## 2014-10-18 MED ORDER — TRAMADOL HCL 50 MG PO TABS: 50.0000 mg | ORAL_TABLET | Freq: Four times a day (QID) | ORAL | Status: DC | PRN

## 2014-10-18 MED ORDER — LORATADINE 10 MG PO TABS: 10.0000 mg | ORAL_TABLET | Freq: Every day | ORAL | Status: DC

## 2014-10-18 MED ORDER — DIPHENHYDRAMINE HCL 25 MG PO CAPS
25.0000 mg | ORAL_CAPSULE | Freq: Once | ORAL | Status: AC
  Administered 2014-10-18: 25 mg via ORAL
  Filled 2014-10-18: qty 1

## 2014-10-18 MED ORDER — KETOROLAC TROMETHAMINE 60 MG/2ML IM SOLN
60.0000 mg | Freq: Once | INTRAMUSCULAR | Status: AC
  Administered 2014-10-18: 60 mg via INTRAMUSCULAR
  Filled 2014-10-18: qty 2

## 2014-10-18 MED ORDER — METOCLOPRAMIDE HCL 10 MG PO TABS
10.0000 mg | ORAL_TABLET | Freq: Four times a day (QID) | ORAL | Status: DC | PRN
Start: 2014-10-18 — End: 2015-10-02

## 2014-10-18 MED ORDER — METOCLOPRAMIDE HCL 10 MG PO TABS
10.0000 mg | ORAL_TABLET | Freq: Once | ORAL | Status: AC
  Administered 2014-10-18: 10 mg via ORAL
  Filled 2014-10-18: qty 1

## 2014-10-18 MED ORDER — OXYMETAZOLINE HCL 0.05 % NA SOLN: 1.0000 | Freq: Two times a day (BID) | NASAL | Status: DC

## 2014-10-18 MED ORDER — TRAMADOL HCL 50 MG PO TABS
50.0000 mg | ORAL_TABLET | Freq: Once | ORAL | Status: AC
  Administered 2014-10-18: 50 mg via ORAL
  Filled 2014-10-18: qty 1

## 2014-10-18 MED ORDER — IBUPROFEN 600 MG PO TABS: 600.0000 mg | ORAL_TABLET | Freq: Four times a day (QID) | ORAL | Status: DC | PRN

## 2014-10-18 NOTE — Discharge Instructions (Signed)
Migraine Headache A migraine headache is an intense, throbbing pain on one or both sides of your head. A migraine can last for 30 minutes to several hours. CAUSES  The exact cause of a migraine headache is not always known. However, a migraine may be caused when nerves in the brain become irritated and release chemicals that cause inflammation. This causes pain. Certain things may also trigger migraines, such as:  Alcohol.  Smoking.  Stress.  Menstruation.  Aged cheeses.  Foods or drinks that contain nitrates, glutamate, aspartame, or tyramine.  Lack of sleep.  Chocolate.  Caffeine.  Hunger.  Physical exertion.  Fatigue.  Medicines used to treat chest pain (nitroglycerine), birth control pills, estrogen, and some blood pressure medicines. SIGNS AND SYMPTOMS  Pain on one or both sides of your head.  Pulsating or throbbing pain.  Severe pain that prevents daily activities.  Pain that is aggravated by any physical activity.  Nausea, vomiting, or both.  Dizziness.  Pain with exposure to bright lights, loud noises, or activity.  General sensitivity to bright lights, loud noises, or smells. Before you get a migraine, you may get warning signs that a migraine is coming (aura). An aura may include:  Seeing flashing lights.  Seeing bright spots, halos, or zigzag lines.  Having tunnel vision or blurred vision.  Having feelings of numbness or tingling.  Having trouble talking.  Having muscle weakness. DIAGNOSIS  A migraine headache is often diagnosed based on:  Symptoms.  Physical exam.  A CT scan or MRI of your head. These imaging tests cannot diagnose migraines, but they can help rule out other causes of headaches. TREATMENT Medicines may be given for pain and nausea. Medicines can also be given to help prevent recurrent migraines.  HOME CARE INSTRUCTIONS  Only take over-the-counter or prescription medicines for pain or discomfort as directed by your  health care provider. The use of long-term narcotics is not recommended.  Lie down in a dark, quiet room when you have a migraine.  Keep a journal to find out what may trigger your migraine headaches. For example, write down:  What you eat and drink.  How much sleep you get.  Any change to your diet or medicines.  Limit alcohol consumption.  Quit smoking if you smoke.  Get 7-9 hours of sleep, or as recommended by your health care provider.  Limit stress.  Keep lights dim if bright lights bother you and make your migraines worse. SEEK IMMEDIATE MEDICAL CARE IF:   Your migraine becomes severe.  You have a fever.  You have a stiff neck.  You have vision loss.  You have muscular weakness or loss of muscle control.  You start losing your balance or have trouble walking.  You feel faint or pass out.  You have severe symptoms that are different from your first symptoms. MAKE SURE YOU:   Understand these instructions.  Will watch your condition.  Will get help right away if you are not doing well or get worse. Document Released: 12/16/2005 Document Revised: 05/02/2014 Document Reviewed: 08/23/2013 ExitCare Patient Information 2015 [employer]. This information is not intended to replace advice given to you by your health care provider. Make sure you discuss any questions you have with your health care provider.  Sinusitis Sinusitis is redness, soreness, and inflammation of the paranasal sinuses. Paranasal sinuses are air pockets within the bones of your face (beneath the eyes, the middle of the forehead, or above the eyes). In healthy paranasal sinuses, mucus   is able to drain out, and air is able to circulate through them by way of your nose. However, when your paranasal sinuses are inflamed, mucus and air can become trapped. This can allow bacteria and other germs to grow and cause infection. Sinusitis can develop quickly and last only a short time (acute) or continue  over a long period (chronic). Sinusitis that lasts for more than 12 weeks is considered chronic.  CAUSES  Causes of sinusitis include:  Allergies.  Structural abnormalities, such as displacement of the cartilage that separates your nostrils (deviated septum), which can decrease the air flow through your nose and sinuses and affect sinus drainage.  Functional abnormalities, such as when the small hairs (cilia) that line your sinuses and help remove mucus do not work properly or are not present. SIGNS AND SYMPTOMS  Symptoms of acute and chronic sinusitis are the same. The primary symptoms are pain and pressure around the affected sinuses. Other symptoms include:  Upper toothache.  Earache.  Headache.  Bad breath.  Decreased sense of smell and taste.  A cough, which worsens when you are lying flat.  Fatigue.  Fever.  Thick drainage from your nose, which often is green and may contain pus (purulent).  Swelling and warmth over the affected sinuses. DIAGNOSIS  Your health care provider will perform a physical exam. During the exam, your health care provider may:  Look in your nose for signs of abnormal growths in your nostrils (nasal polyps).  Tap over the affected sinus to check for signs of infection.  View the inside of your sinuses (endoscopy) using an imaging device that has a light attached (endoscope). If your health care provider suspects that you have chronic sinusitis, one or more of the following tests may be recommended:  Allergy tests.  Nasal culture. A sample of mucus is taken from your nose, sent to a lab, and screened for bacteria.  Nasal cytology. A sample of mucus is taken from your nose and examined by your health care provider to determine if your sinusitis is related to an allergy. TREATMENT  Most cases of acute sinusitis are related to a viral infection and will resolve on their own within 10 days. Sometimes medicines are prescribed to help relieve  symptoms (pain medicine, decongestants, nasal steroid sprays, or saline sprays).  However, for sinusitis related to a bacterial infection, your health care provider will prescribe antibiotic medicines. These are medicines that will help kill the bacteria causing the infection.  Rarely, sinusitis is caused by a fungal infection. In theses cases, your health care provider will prescribe antifungal medicine. For some cases of chronic sinusitis, surgery is needed. Generally, these are cases in which sinusitis recurs more than 3 times per year, despite other treatments. HOME CARE INSTRUCTIONS   Drink plenty of water. Water helps thin the mucus so your sinuses can drain more easily.  Use a humidifier.  Inhale steam 3 to 4 times a day (for example, sit in the bathroom with the shower running).  Apply a warm, moist washcloth to your face 3 to 4 times a day, or as directed by your health care provider.  Use saline nasal sprays to help moisten and clean your sinuses.  Take medicines only as directed by your health care provider.  If you were prescribed either an antibiotic or antifungal medicine, finish it all even if you start to feel better. SEEK IMMEDIATE MEDICAL CARE IF:  You have increasing pain or severe headaches.  You have nausea, vomiting,   or drowsiness.  You have swelling around your face.  You have vision problems.  You have a stiff neck.  You have difficulty breathing. MAKE SURE YOU:   Understand these instructions.  Will watch your condition.  Will get help right away if you are not doing well or get worse. Document Released: 12/16/2005 Document Revised: 05/02/2014 Document Reviewed: 12/31/2011 Exit[employer] Patient Information 2015 [employer]. This information is not intended to replace advice given to you by your health care provider. Make sure you discuss any questions you have with your health care provider.  

## 2014-11-11 DIAGNOSIS — E119 Type 2 diabetes mellitus without complications: Secondary | ICD-10-CM | POA: Insufficient documentation

## 2015-04-25 IMAGING — US US ABDOMEN COMPLETE
1 series · 14 of 25 positions shown · non-contrast
Comparison: CT abdomen 11/09/2013, 07/31/2009

CLINICAL DATA: Right upper quadrant pain, hypertension, diabetes

EXAM:
ULTRASOUND ABDOMEN COMPLETE

[Series 1: us abdomen complete · 0.22mm/px · 14 of 72 slices shown]
[im 1/72]
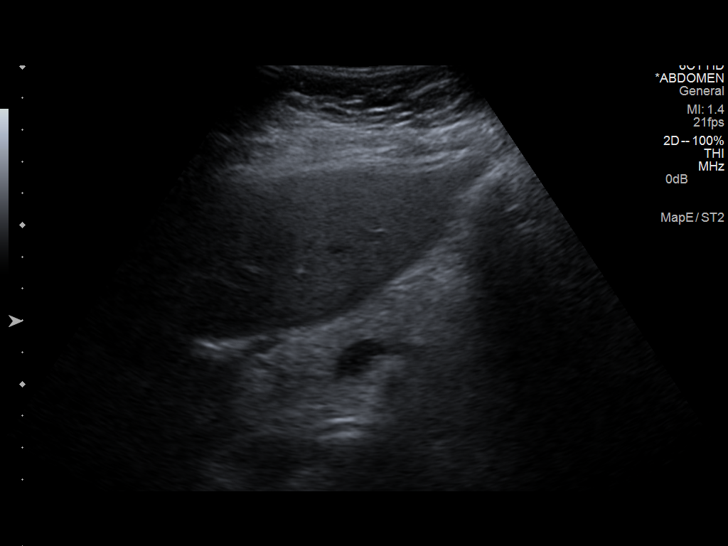
[im 6/72]
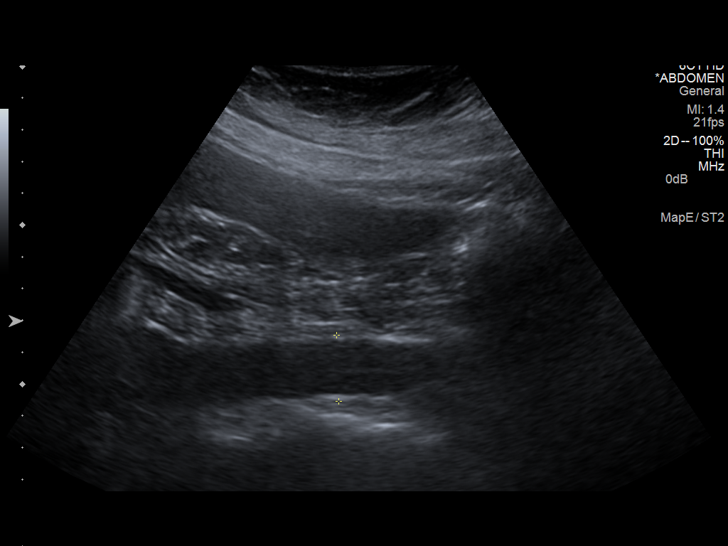
[im 12/72]
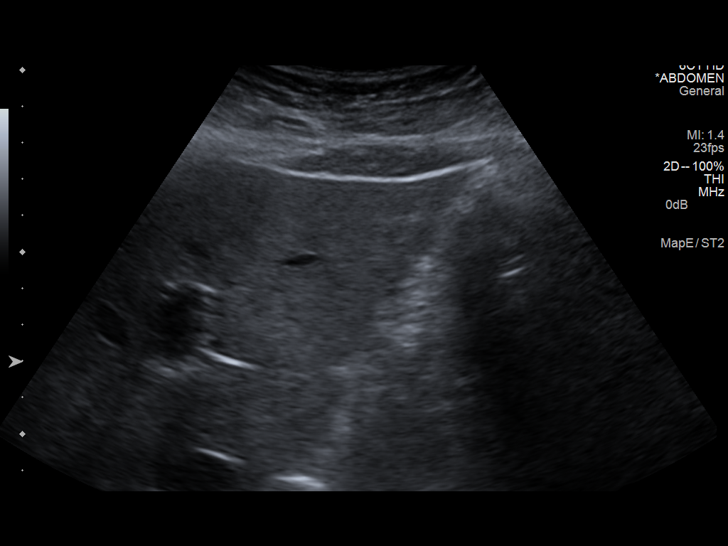
[im 18/72]
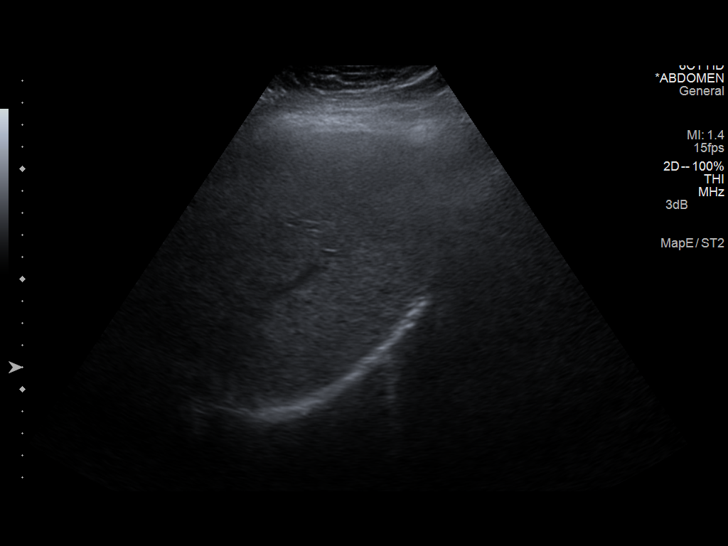
[im 24/72]
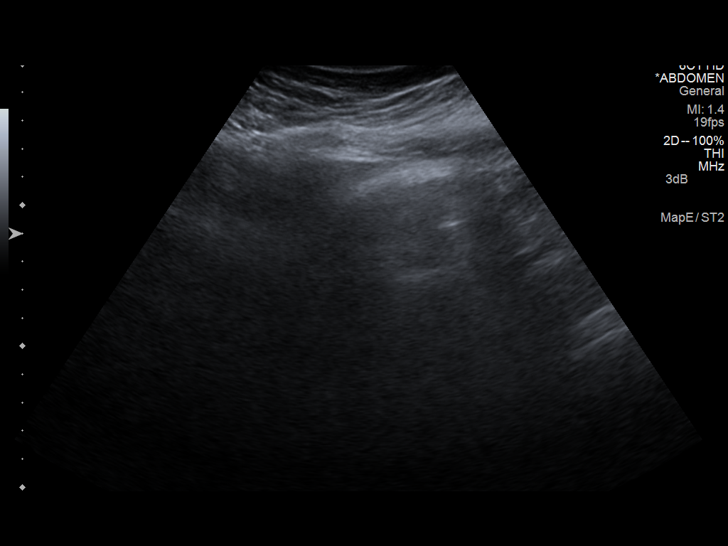
[im 27/72]
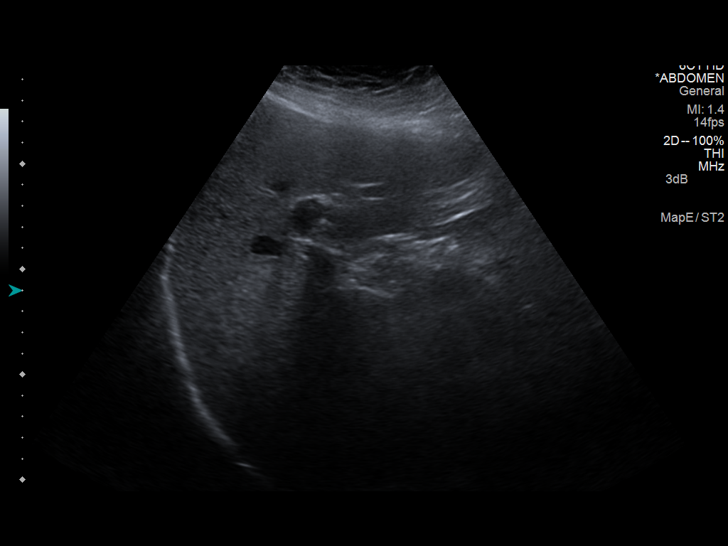
[im 33/72]
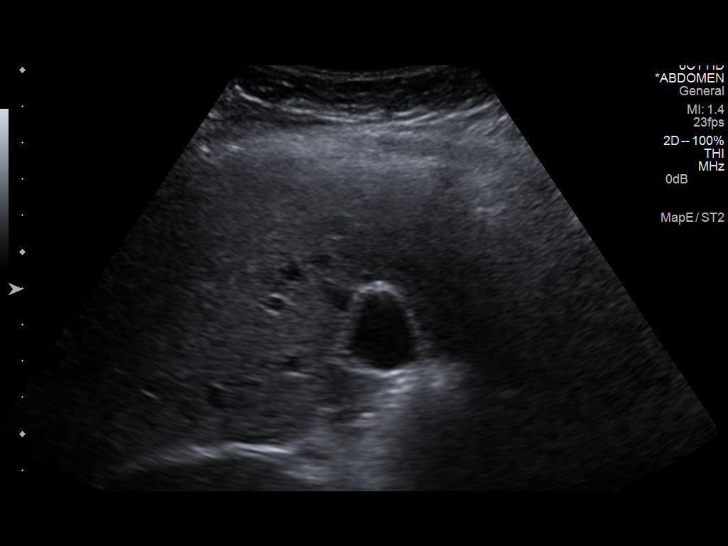
[im 39/72]
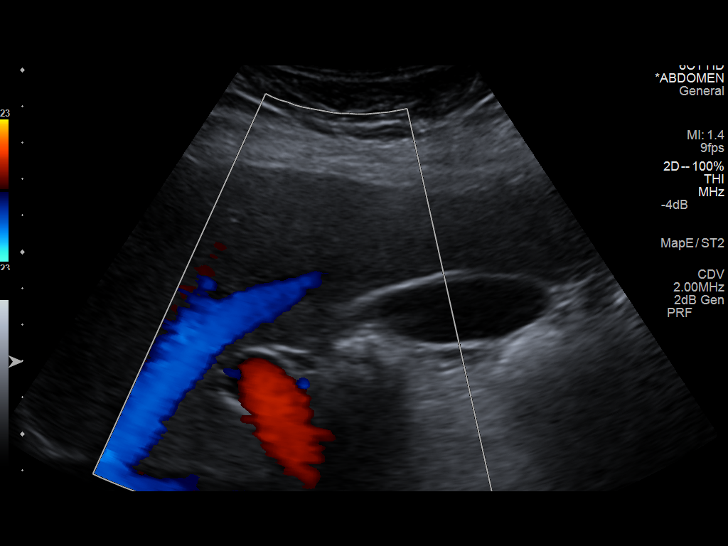
[im 45/72]
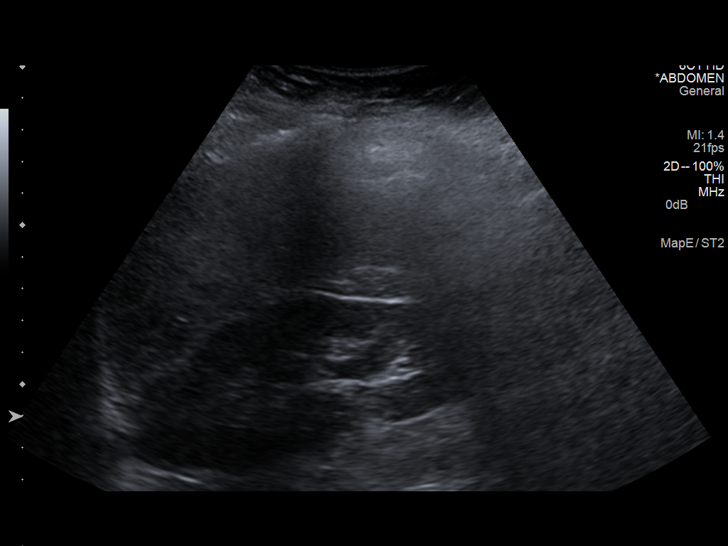
[im 48/72]
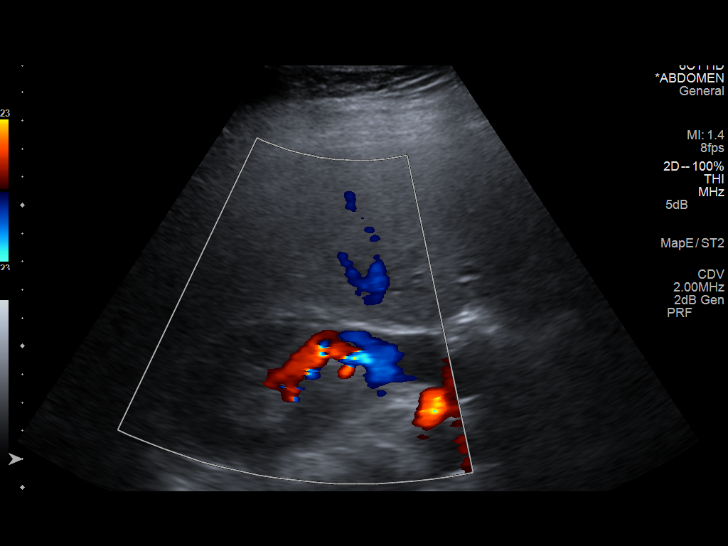
[im 54/72]
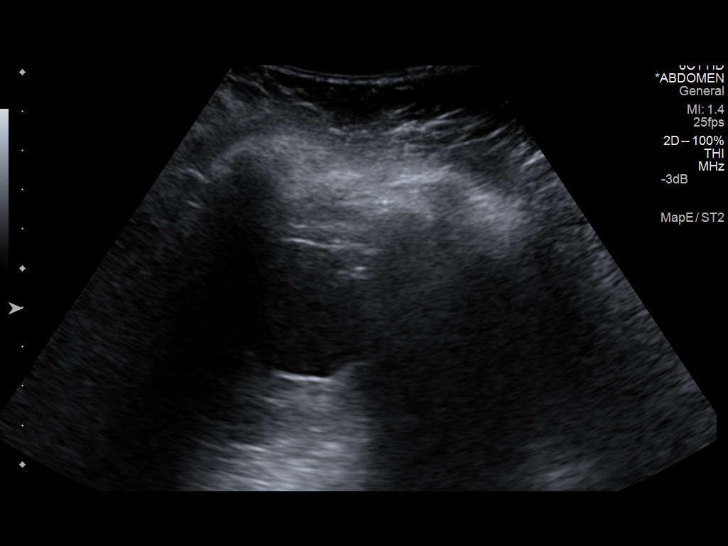
[im 60/72]
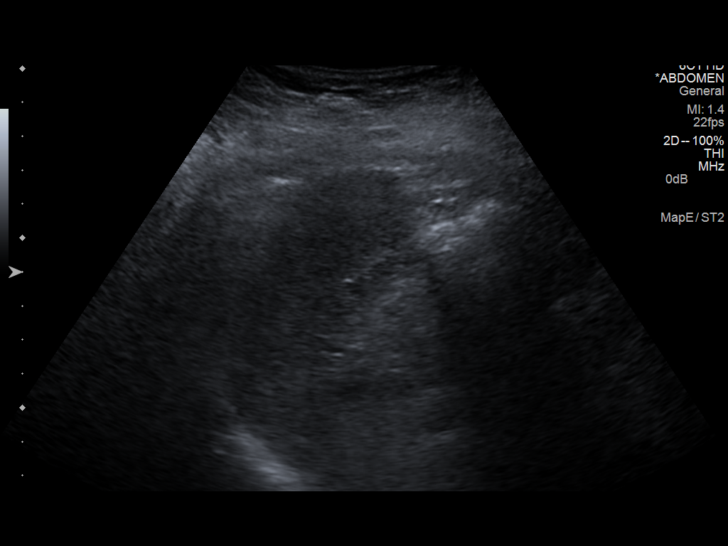
[im 66/72]
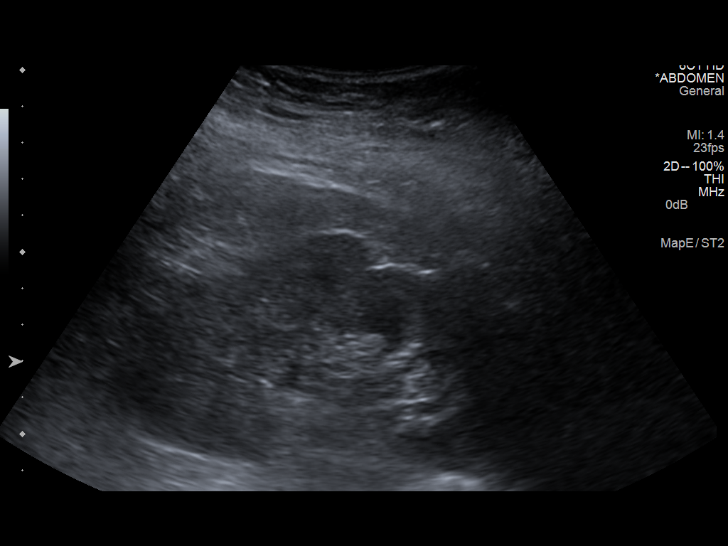
[im 72/72]
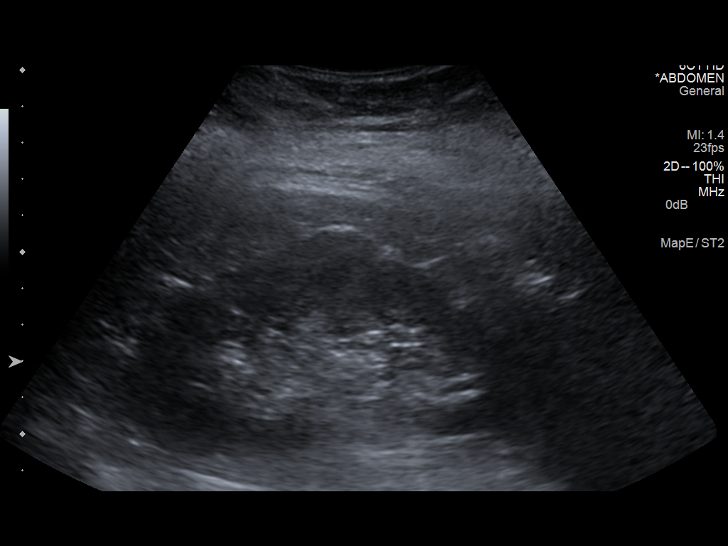

[14 of 25 positions shown; findings below may reference images not displayed]

FINDINGS: Gallbladder:

Normally distended without stones or wall thickening.

No pericholecystic fluid or sonographic Murphy sign.

Common bile duct:

Diameter: Normal caliber 3 mm diameter

Liver:

Normal appearance

IVC:

Normal appearance

Pancreas:

Tail obscured by bowel gas.  Visualized portion normal appearance.

Spleen:

Normal appearance, 6.1 cm length

Right Kidney:

Length: 11.8 cm. Normal cortical thickness and echogenicity.
Hypoechoic nodule mid right kidney containing scattered internal
echoes, questions minimally complicated cyst 3.3 x 3.2 x 3.9 cm,
measured 3.6 x 3.1 x 3.0 cm on the CT exam from 9393. No additional
mass or hydronephrosis.

Left Kidney:

Length: 12.2 cm.  Normal morphology without mass or hydronephrosis.

Abdominal aorta:

Normal caliber

Other findings:

No free-fluid
IMPRESSION: Right renal cyst, question minimally complicated, little changed
since 9393.

Otherwise negative exam.

## 2015-05-25 DIAGNOSIS — K432 Incisional hernia without obstruction or gangrene: Secondary | ICD-10-CM | POA: Insufficient documentation

## 2015-07-16 ENCOUNTER — Emergency Department (HOSPITAL_BASED_OUTPATIENT_CLINIC_OR_DEPARTMENT_OTHER)
Admission: EM | Admit: 2015-07-16 | Discharge: 2015-07-16 | Disposition: A | Discharge status: Home / Self Care | Payer: 59 | Attending: Emergency Medicine | Admitting: Emergency Medicine

## 2015-07-16 ENCOUNTER — Emergency Department (HOSPITAL_BASED_OUTPATIENT_CLINIC_OR_DEPARTMENT_OTHER): Payer: 59

## 2015-07-16 ENCOUNTER — Encounter (HOSPITAL_BASED_OUTPATIENT_CLINIC_OR_DEPARTMENT_OTHER): Payer: Self-pay | Admitting: *Deleted

## 2015-07-16 DIAGNOSIS — R630 Anorexia: Secondary | ICD-10-CM | POA: Insufficient documentation

## 2015-07-16 DIAGNOSIS — Z862 Personal history of diseases of the blood and blood-forming organs and certain disorders involving the immune mechanism: Secondary | ICD-10-CM | POA: Insufficient documentation

## 2015-07-16 DIAGNOSIS — R52 Pain, unspecified: Secondary | ICD-10-CM

## 2015-07-16 DIAGNOSIS — I10 Essential (primary) hypertension: Secondary | ICD-10-CM | POA: Insufficient documentation

## 2015-07-16 DIAGNOSIS — E119 Type 2 diabetes mellitus without complications: Secondary | ICD-10-CM | POA: Diagnosis not present

## 2015-07-16 DIAGNOSIS — Z8719 Personal history of other diseases of the digestive system: Secondary | ICD-10-CM | POA: Diagnosis not present

## 2015-07-16 DIAGNOSIS — R1110 Vomiting, unspecified: Secondary | ICD-10-CM | POA: Diagnosis not present

## 2015-07-16 DIAGNOSIS — R1031 Right lower quadrant pain: Secondary | ICD-10-CM | POA: Diagnosis present

## 2015-07-16 DIAGNOSIS — G43909 Migraine, unspecified, not intractable, without status migrainosus: Secondary | ICD-10-CM | POA: Insufficient documentation

## 2015-07-16 DIAGNOSIS — Z79899 Other long term (current) drug therapy: Secondary | ICD-10-CM | POA: Insufficient documentation

## 2015-07-16 DIAGNOSIS — G8918 Other acute postprocedural pain: Secondary | ICD-10-CM | POA: Diagnosis not present

## 2015-07-16 LAB — COMPREHENSIVE METABOLIC PANEL
ALBUMIN: 3.9 g/dL (ref 3.5–5.0)
ALK PHOS: 46 U/L (ref 38–126)
ALT: 19 U/L (ref 14–54)
AST: 19 U/L (ref 15–41)
Anion gap: 8 (ref 5–15)
BUN: 12 mg/dL (ref 6–20)
CO2: 29 mmol/L (ref 22–32)
Calcium: 8.8 mg/dL — ABNORMAL LOW (ref 8.9–10.3)
Chloride: 99 mmol/L — ABNORMAL LOW (ref 101–111)
Creatinine, Ser: 0.66 mg/dL (ref 0.44–1.00)
GFR calc Af Amer: >60 mL/min (ref >60)
GFR calc non Af Amer: >60 mL/min (ref >60)
Glucose, Bld: 103 mg/dL — ABNORMAL HIGH (ref 65–99)
Potassium: 3.2 mmol/L — ABNORMAL LOW (ref 3.5–5.1)
Sodium: 136 mmol/L (ref 135–145)
Total Bilirubin: 0.8 mg/dL (ref 0.3–1.2)
Total Protein: 7.4 g/dL (ref 6.5–8.1)

## 2015-07-16 LAB — CBC WITH DIFFERENTIAL/PLATELET
BASOS ABS: 0 10*3/uL (ref 0.0–0.1)
BASOS PCT: 0 % (ref 0–1)
EOS ABS: 0.6 10*3/uL (ref 0.0–0.7)
Eosinophils Relative: 12 % — ABNORMAL HIGH (ref 0–5)
HEMATOCRIT: 37.6 % (ref 36.0–46.0)
HEMOGLOBIN: 12.5 g/dL (ref 12.0–15.0)
Lymphocytes Relative: 46 % (ref 12–46)
Lymphs Abs: 2.5 10*3/uL (ref 0.7–4.0)
MCH: 28.6 pg (ref 26.0–34.0)
MCHC: 33.2 g/dL (ref 30.0–36.0)
MCV: 86 fL (ref 78.0–100.0)
MONO ABS: 0.4 10*3/uL (ref 0.1–1.0)
Monocytes Relative: 8 % (ref 3–12)
Neutro Abs: 1.8 10*3/uL (ref 1.7–7.7)
Neutrophils Relative %: 34 % — ABNORMAL LOW (ref 43–77)
Platelets: 284 10*3/uL (ref 150–400)
RBC: 4.37 MIL/uL (ref 3.87–5.11)
RDW: 12.7 % (ref 11.5–15.5)
WBC: 5.3 10*3/uL (ref 4.0–10.5)

## 2015-07-16 LAB — LIPASE, BLOOD: LIPASE: 22 U/L (ref 22–51)

## 2015-07-16 MED ORDER — OXYCODONE-ACETAMINOPHEN 5-325 MG PO TABS: 1.0000 | ORAL_TABLET | Freq: Four times a day (QID) | ORAL | Status: DC | PRN

## 2015-07-16 MED ORDER — MORPHINE SULFATE 4 MG/ML IJ SOLN
6.0000 mg | Freq: Once | INTRAMUSCULAR | Status: AC
  Administered 2015-07-16: 6 mg via INTRAVENOUS
  Filled 2015-07-16: qty 2

## 2015-07-16 MED ORDER — IOHEXOL 300 MG/ML  SOLN
100.0000 mL | Freq: Once | INTRAMUSCULAR | Status: AC | PRN
  Administered 2015-07-16: 100 mL via INTRAVENOUS

## 2015-07-16 MED ORDER — POTASSIUM CHLORIDE CRYS ER 20 MEQ PO TBCR
40.0000 meq | EXTENDED_RELEASE_TABLET | Freq: Once | ORAL | Status: AC
  Administered 2015-07-16: 40 meq via ORAL
  Filled 2015-07-16: qty 2

## 2015-07-16 MED ORDER — IOHEXOL 300 MG/ML  SOLN
50.0000 mL | Freq: Once | INTRAMUSCULAR | Status: AC | PRN
  Administered 2015-07-16: 50 mL via ORAL

## 2015-07-16 NOTE — ED Provider Notes (Signed)
CSN: [id number]     Arrival date & time 07/16/15  1603 History  This chart was scribed for Doug Sou, MD by Abel Presto, ED Scribe. This patient was seen in room MH12/MH12 and the patient's care was started at 4:55 PM.    Chief Complaint  Patient presents with  . Abdominal Pain     Patient is a 44 y.o. female presenting with abdominal pain. The history is provided by the patient. No language interpreter was used.  Abdominal Pain Pain location:  RLQ (to incision site) Pain radiates to:  Does not radiate Duration:  3 days Timing:  Constant Progression:  Worsening Chronicity:  New Context: previous surgery   Relieved by: narcotic. Worsened by:  Movement Associated symptoms: anorexia, chills, fever and vomiting    HPI Comments: Leslie Gentry is a 44 y.o. female with PMHx of DM, HTN, and hernia who presents to the Emergency Department complaining of right sided abdominal pain to incision site with unknown onset worsening 2 days ago. Pt is s/p hernia repair and scar tissue removal on 06/19/15. She states movement worsens the pain. She notes associated low grade fever at 100.1, vomiting yesterday which has resolved, anorexia, and chills. Pain worse with changing position, improved with remaining still. She vomited 2 days ago. No nausea present. Max temperature 100.1 degrees. No other associated symptoms. Pain is constant, not affected by eating. No urinary symptoms. Pt has taken oxycodone with some relief. Pt has a f/u appointment with surgeon on 07/20/15. She is not a smoker and denies EtOH and drug use. Pt has had a hysterectomy. Pt's son will driving home. Pt denies drainage or redness.  Past Medical History  Diagnosis Date  . Hypertension   . Diabetes mellitus   . Gallstones   . H/O hiatal hernia   . Headache(784.0)     hx of migraines   . Anemia   . Migraine    Past Surgical History  Procedure Laterality Date  . Abdominal hysterectomy    . Appendectomy    .  Tonsillectomy    . Cesarean section    . Cholecystectomy N/A 12/28/2013    Procedure: LAPAROSCOPIC CHOLECYSTECTOMY WITH INTRAOPERATIVE CHOLANGIOGRAM;  Surgeon: Romie Levee, MD;  Location: [facility];  Service: General;  Laterality: N/A;  . Hernia repair     No family history on file. History  Substance Use Topics  . Smoking status: Never Smoker   . Smokeless tobacco: Never Used  . Alcohol Use: No   OB History    No data available     Review of Systems  Constitutional: Positive for fever and chills.  HENT: Negative.   Respiratory: Negative.   Cardiovascular: Negative.   Gastrointestinal: Positive for vomiting, abdominal pain and anorexia.  Musculoskeletal: Negative.   Skin: Negative.   Neurological: Negative.   Psychiatric/Behavioral: Negative.   All other systems reviewed and are negative.     Allergies  Chlorhexidine and Erythromycin  Home Medications   Prior to Admission medications   Medication Sig Start Date End Date Taking? Authorizing Provider  amitriptyline (ELAVIL) 10 MG tablet Take 40 mg by mouth at bedtime.   Yes Historical Provider, MD  amLODipine (NORVASC) 10 MG tablet Take 10 mg by mouth daily.   Yes Historical Provider, MD  gabapentin (NEURONTIN) 300 MG capsule Take 300 mg by mouth 3 (three) times daily.   Yes Historical Provider, MD  ibuprofen (ADVIL,MOTRIN) 200 MG tablet Take 200 mg by mouth as needed for mild pain or moderate  pain.    Yes Historical Provider, MD  ibuprofen (ADVIL,MOTRIN) 600 MG tablet Take 1 tablet (600 mg total) by mouth every 6 (six) hours as needed. 10/18/14  Yes Loren Raceravid Yelverton, MD  metFORMIN (GLUCOPHAGE) 1000 MG tablet Take 1,000 mg by mouth 2 (two) times daily with a meal.   Yes Historical Provider, MD  loratadine (CLARITIN) 10 MG tablet Take 1 tablet (10 mg total) by mouth daily. 10/18/14   Loren Raceravid Yelverton, MD  metoCLOPramide (REGLAN) 10 MG tablet Take 1 tablet (10 mg total) by mouth every 6 (six) hours as needed for nausea  (nausea/headache). 10/18/14   Loren Raceravid Yelverton, MD  ondansetron (ZOFRAN ODT) 4 MG disintegrating tablet Take 1 tablet (4 mg total) by mouth every 8 (eight) hours as needed for nausea or vomiting. 05/02/14   Mellody DrownLauren Parker, PA-C  oxyCODONE-acetaminophen (ROXICET) 5-325 MG per tablet Take 1-2 tablets by mouth every 4 (four) hours as needed for severe pain. 12/28/13   Romie LeveeAlicia Thomas, MD  oxymetazoline (AFRIN NASAL SPRAY) 0.05 % nasal spray Place 1 spray into both nostrils 2 (two) times daily. 10/18/14   Loren Raceravid Yelverton, MD  traMADol (ULTRAM) 50 MG tablet Take 1 tablet (50 mg total) by mouth every 6 (six) hours as needed. 10/18/14   Loren Raceravid Yelverton, MD   BP 152/96 mmHg  Pulse 88  Temp(Src) 98.6 F (37 C) (Oral)  Resp 18  Ht 5\' 5"  (1.651 m)  Wt 235 lb (106.595 kg)  BMI 39.11 kg/m2  SpO2 100% Physical Exam  Constitutional: She appears well-developed and well-nourished. No distress.  HENT:  Head: Normocephalic and atraumatic.  Eyes: Conjunctivae are normal. Pupils are equal, round, and reactive to light.  Neck: Neck supple. No tracheal deviation present. No thyromegaly present.  Cardiovascular: Normal rate and regular rhythm.   No murmur heard. Pulmonary/Chest: Effort normal and breath sounds normal.  Abdominal: Soft. Bowel sounds are normal. She exhibits no distension. There is tenderness.  Obese, well-healed surgical scar in right upper quadrant. Tenderness at right upper quadrant no guarding rigidity or rebound  Musculoskeletal: Normal range of motion. She exhibits no edema or tenderness.  Neurological: She is alert. Coordination normal.  Skin: Skin is warm and dry. No rash noted.  Psychiatric: She has a normal mood and affect.  Nursing note and vitals reviewed.   ED Course  Procedures (including critical care time) DIAGNOSTIC STUDIES: Oxygen Saturation is 100% on room air, normal by my interpretation.    COORDINATION OF CARE: 5:00 PM Discussed treatment plan with patient at beside, the  patient agrees with the plan and has no further questions at this time.   Labs Review Labs Reviewed - No data to display  Imaging Review No results found.   EKG Interpretation None     6:30 PM pain improved after treatment with intravenous opiates. Declined further pain medicine.  8 PM pain remains under control. Results for orders placed or performed during the hospital encounter of 07/16/15  Comprehensive metabolic panel  Result Value Ref Range   Sodium 136 135 - 145 mmol/L   Potassium 3.2 (L) 3.5 - 5.1 mmol/L   Chloride 99 (L) 101 - 111 mmol/L   CO2 29 22 - 32 mmol/L   Glucose, Bld 103 (H) 65 - 99 mg/dL   BUN 12 6 - 20 mg/dL   Creatinine, Ser 1.610.66 0.44 - 1.00 mg/dL   Calcium 8.8 (L) 8.9 - 10.3 mg/dL   Total Protein 7.4 6.5 - 8.1 g/dL   Albumin 3.9 3.5 - 5.0  g/dL   AST 19 15 - 41 U/L   ALT 19 14 - 54 U/L   Alkaline Phosphatase 46 38 - 126 U/L   Total Bilirubin 0.8 0.3 - 1.2 mg/dL   GFR calc non Af Amer >60 >60 mL/min   GFR calc Af Amer >60 >60 mL/min   Anion gap 8 5 - 15  CBC with Differential/Platelet  Result Value Ref Range   WBC 5.3 4.0 - 10.5 K/uL   RBC 4.37 3.87 - 5.11 MIL/uL   Hemoglobin 12.5 12.0 - 15.0 g/dL   HCT 16.1 09.6 - 04.5 %   MCV 86.0 78.0 - 100.0 fL   MCH 28.6 26.0 - 34.0 pg   MCHC 33.2 30.0 - 36.0 g/dL   RDW 40.9 81.1 - 91.4 %   Platelets 284 150 - 400 K/uL   Neutrophils Relative % 34 (L) 43 - 77 %   Neutro Abs 1.8 1.7 - 7.7 K/uL   Lymphocytes Relative 46 12 - 46 %   Lymphs Abs 2.5 0.7 - 4.0 K/uL   Monocytes Relative 8 3 - 12 %   Monocytes Absolute 0.4 0.1 - 1.0 K/uL   Eosinophils Relative 12 (H) 0 - 5 %   Eosinophils Absolute 0.6 0.0 - 0.7 K/uL   Basophils Relative 0 0 - 1 %   Basophils Absolute 0.0 0.0 - 0.1 K/uL  Lipase, blood  Result Value Ref Range   Lipase 22 22 - 51 U/L   Ct Abdomen Pelvis W Contrast  07/16/2015   CLINICAL DATA:  44 year old female with right-sided abdominal pain. History of recent hernia repair and scar tissue  removal.  EXAM: CT ABDOMEN AND PELVIS WITH CONTRAST  TECHNIQUE: Multidetector CT imaging of the abdomen and pelvis was performed using the standard protocol following bolus administration of intravenous contrast.  CONTRAST:  50mL OMNIPAQUE IOHEXOL 300 MG/ML SOLN, OMNIPAQUE IOHEXOL 300 MG/ML SOLN  COMPARISON:  CT dated 11/09/2013 and ultrasound dated 12/13/2013  FINDINGS: The visualized lung bases are clear. Partially visualized small pericardial effusion measuring up to 7 mm in axial thickness. No intra-abdominal free air. Trace free fluid within the pelvis.  Diffuse hepatic steatosis. Cholecystectomy. The pancreas, spleen, adrenal glands appear unremarkable. Stable appearing 3.3 cm right renal cyst. There is mild bilateral renal cortical irregularity. There is no hydronephrosis on either side. The visualized ureters and urinary bladder appear unremarkable. Hysterectomy.  There is sigmoid diverticulosis without acute inflammatory changes. Constipation. No evidence of bowel obstruction or inflammation. The the visualized appendix appears unremarkable.  The abdominal aorta and IVC appear patent. No portal venous gas identified. There is no lymphadenopathy. An umbilical hernia repair mesh is noted. There is mild induration of the anterior pelvic wall subcutaneous fat. No drainable fluid collection/ abscess identified. Mild degenerative changes of the spine. No acute fracture.  IMPRESSION: Constipation.  No evidence of bowel obstruction or inflammation.  Umbilical hernia repair mesh appears intact.   Electronically Signed   By: Elgie Collard M.D.   On: 07/16/2015 18:58    MDM  CT scan report reviewed by me. Patient has no pelvic pain. We will write prescription for Percocet 10 tablets. She is to keep her scheduled plan with her surgeon in 4 days Final diagnoses:  None  Dx #1 postoperative pain #2 hypokalemia        Doug Sou, MD 07/16/15 2008

## 2015-07-16 NOTE — ED Notes (Signed)
Reports hernia repair with scar tissue removal on June 20. States she is having right side abdominal pain and pain at her incision site

## 2015-07-16 NOTE — Discharge Instructions (Signed)
Keep your scheduled appointment with your surgeon in 4 days. Take the disc that you were given of today's CT scan with you to your office visit.

## 2015-10-02 ENCOUNTER — Emergency Department (HOSPITAL_BASED_OUTPATIENT_CLINIC_OR_DEPARTMENT_OTHER)
Admission: EM | Admit: 2015-10-02 | Discharge: 2015-10-02 | Disposition: A | Discharge status: Home / Self Care | Payer: 59 | Attending: Emergency Medicine | Admitting: Emergency Medicine

## 2015-10-02 ENCOUNTER — Encounter (HOSPITAL_BASED_OUTPATIENT_CLINIC_OR_DEPARTMENT_OTHER): Payer: Self-pay | Admitting: *Deleted

## 2015-10-02 DIAGNOSIS — E119 Type 2 diabetes mellitus without complications: Secondary | ICD-10-CM | POA: Diagnosis not present

## 2015-10-02 DIAGNOSIS — Z79899 Other long term (current) drug therapy: Secondary | ICD-10-CM | POA: Diagnosis not present

## 2015-10-02 DIAGNOSIS — Z8719 Personal history of other diseases of the digestive system: Secondary | ICD-10-CM | POA: Insufficient documentation

## 2015-10-02 DIAGNOSIS — Z862 Personal history of diseases of the blood and blood-forming organs and certain disorders involving the immune mechanism: Secondary | ICD-10-CM | POA: Diagnosis not present

## 2015-10-02 DIAGNOSIS — G43909 Migraine, unspecified, not intractable, without status migrainosus: Secondary | ICD-10-CM | POA: Diagnosis not present

## 2015-10-02 DIAGNOSIS — R109 Unspecified abdominal pain: Secondary | ICD-10-CM | POA: Diagnosis present

## 2015-10-02 DIAGNOSIS — I10 Essential (primary) hypertension: Secondary | ICD-10-CM | POA: Insufficient documentation

## 2015-10-02 LAB — CBC WITH DIFFERENTIAL/PLATELET
Basophils Absolute: 0 10*3/uL (ref 0.0–0.1)
Basophils Relative: 0 %
Eosinophils Absolute: 0.1 10*3/uL (ref 0.0–0.7)
Eosinophils Relative: 2 %
HCT: 39.8 % (ref 36.0–46.0)
Hemoglobin: 13.1 g/dL (ref 12.0–15.0)
Lymphocytes Relative: 43 %
Lymphs Abs: 2 10*3/uL (ref 0.7–4.0)
MCH: 28.4 pg (ref 26.0–34.0)
MCHC: 32.9 g/dL (ref 30.0–36.0)
MCV: 86.1 fL (ref 78.0–100.0)
Monocytes Absolute: 0.4 10*3/uL (ref 0.1–1.0)
Monocytes Relative: 8 %
Neutro Abs: 2.3 10*3/uL (ref 1.7–7.7)
Neutrophils Relative %: 47 %
Platelets: 305 10*3/uL (ref 150–400)
RBC: 4.62 MIL/uL (ref 3.87–5.11)
RDW: 13 % (ref 11.5–15.5)
WBC: 4.8 10*3/uL (ref 4.0–10.5)

## 2015-10-02 LAB — URINALYSIS, ROUTINE W REFLEX MICROSCOPIC
Bilirubin Urine: NEGATIVE
Glucose, UA: NEGATIVE mg/dL
Hgb urine dipstick: NEGATIVE
Ketones, ur: NEGATIVE mg/dL
Leukocytes, UA: NEGATIVE
Nitrite: NEGATIVE
Protein, ur: NEGATIVE mg/dL
Specific Gravity, Urine: 1.008 (ref 1.005–1.030)
Urobilinogen, UA: 0.2 mg/dL (ref 0.0–1.0)
pH: 6.5 (ref 5.0–8.0)

## 2015-10-02 LAB — COMPREHENSIVE METABOLIC PANEL
ALT: 17 U/L (ref 14–54)
AST: 18 U/L (ref 15–41)
Albumin: 4 g/dL (ref 3.5–5.0)
Alkaline Phosphatase: 50 U/L (ref 38–126)
Anion gap: 9 (ref 5–15)
BUN: 7 mg/dL (ref 6–20)
CO2: 29 mmol/L (ref 22–32)
Calcium: 9.1 mg/dL (ref 8.9–10.3)
Chloride: 101 mmol/L (ref 101–111)
Creatinine, Ser: 0.64 mg/dL (ref 0.44–1.00)
GFR calc Af Amer: >60 mL/min (ref >60)
GFR calc non Af Amer: >60 mL/min (ref >60)
Glucose, Bld: 165 mg/dL — ABNORMAL HIGH (ref 65–99)
Potassium: 3.7 mmol/L (ref 3.5–5.1)
Sodium: 139 mmol/L (ref 135–145)
Total Bilirubin: 0.5 mg/dL (ref 0.3–1.2)
Total Protein: 7.6 g/dL (ref 6.5–8.1)

## 2015-10-02 MED ORDER — IBUPROFEN 800 MG PO TABS
800.0000 mg | ORAL_TABLET | Freq: Once | ORAL | Status: AC
  Administered 2015-10-02: 800 mg via ORAL
  Filled 2015-10-02: qty 1

## 2015-10-02 NOTE — Discharge Instructions (Signed)

## 2015-10-02 NOTE — ED Notes (Signed)
C/o right lower abd that started last pm. No n/v/d. No fever. No vaginal. Frequent urination but no burning.

## 2015-10-02 NOTE — ED Provider Notes (Signed)
CSN: [id number]     Arrival date & time 10/02/15  1041 History   First MD Initiated Contact with Patient 10/02/15 1045     No chief complaint on file.  HPI   44 year old female presents today with right abdominal pain. Patient reports symptoms started last night while watching TV, described as sharp stabbing pain in the right mid lateral abdomen, no radiation of symptoms. Patient denies fever, chills, nausea, vomiting, upper abdominal pain, pain in her groin or suprapubic region, vaginal discharge or bleeding, diarrhea, constipation. Patient reports her last normal bowel movement was yesterday after the pain had begun, and this did not improve or worsened symptoms. Patient has a significant surgical history of abdominal hysterectomy, appendectomy, cesarean section, cholecystectomy, hernia repair. Patient was seen in the emergency room on 07/16/2015 with right abdominal pain, CT scan showed no significant findings at that time.   Past Medical History  Diagnosis Date  . Hypertension   . Diabetes mellitus   . Gallstones   . H/O hiatal hernia   . Headache(784.0)     hx of migraines   . Anemia   . Migraine    Past Surgical History  Procedure Laterality Date  . Abdominal hysterectomy    . Appendectomy    . Tonsillectomy    . Cesarean section    . Cholecystectomy N/A 12/28/2013    Procedure: LAPAROSCOPIC CHOLECYSTECTOMY WITH INTRAOPERATIVE CHOLANGIOGRAM;  Surgeon: Romie Levee, MD;  Location: [facility];  Service: General;  Laterality: N/A;  . Hernia repair     No family history on file. Social History  Substance Use Topics  . Smoking status: Never Smoker   . Smokeless tobacco: Never Used  . Alcohol Use: No   OB History    No data available     Review of Systems  All other systems reviewed and are negative.   Allergies  Chlorhexidine and Erythromycin  Home Medications   Prior to Admission medications   Medication Sig Start Date End Date Taking? Authorizing Provider   amitriptyline (ELAVIL) 10 MG tablet Take 40 mg by mouth at bedtime.   Yes Historical Provider, MD  amLODipine (NORVASC) 10 MG tablet Take 10 mg by mouth daily.   Yes Historical Provider, MD  gabapentin (NEURONTIN) 300 MG capsule Take 300 mg by mouth 3 (three) times daily.   Yes Historical Provider, MD  loratadine (CLARITIN) 10 MG tablet Take 1 tablet (10 mg total) by mouth daily. 10/18/14  Yes Loren Racer, MD  metFORMIN (GLUCOPHAGE) 1000 MG tablet Take 1,000 mg by mouth 2 (two) times daily with a meal.   Yes Historical Provider, MD   BP 161/116 mmHg  Pulse 84  Temp(Src) 98.3 F (36.8 C) (Oral)  Resp 16  Ht  (1.651 m)  Wt 230 lb (104.327 kg)  BMI 38.27 kg/m2  SpO2 99%   Physical Exam  Constitutional: She is oriented to person, place, and time. She appears well-developed and well-nourished.  HENT:  Head: Normocephalic and atraumatic.  Eyes: Conjunctivae are normal. Pupils are equal, round, and reactive to light. Right eye exhibits no discharge. Left eye exhibits no discharge. No scleral icterus.  Neck: Normal range of motion. No JVD present. No tracheal deviation present.  Pulmonary/Chest: Effort normal. No stridor.  Abdominal: Soft. She exhibits no distension and no mass. There is tenderness. There is no rebound and no guarding.  TTP of right mid lateral abdomen. Surgical scars noted to abdomen. No hernias felt on palpation  Neurological: She is alert and  oriented to person, place, and time. Coordination normal.  Skin: Skin is warm and dry. No rash noted. No erythema. No pallor.  Psychiatric: She has a normal mood and affect. Her behavior is normal. Judgment and thought content normal.  Nursing note and vitals reviewed.   ED Course  Procedures (including critical care time) Labs Review Labs Reviewed  COMPREHENSIVE METABOLIC PANEL - Abnormal; Notable for the following:    Glucose, Bld 165 (*)    All other components within normal limits  URINALYSIS, ROUTINE W REFLEX  MICROSCOPIC (NOT AT [facility]) - Abnormal; Notable for the following:    APPearance CLOUDY (*)    All other components within normal limits  CBC WITH DIFFERENTIAL/PLATELET    Imaging Review No results found. I have personally reviewed and evaluated these images and lab results as part of my medical decision-making.   EKG Interpretation None      MDM   Final diagnoses:  Right sided abdominal pain    Labs: CBC, CMP, urinalysis- no significant findings  Imaging:   Consults:  Therapeutics: Ibuprofen  Discharge Meds:   Assessment/Plan: Patient presents with right sided abdominal pain. Patient has a significant past medical history of the same, she's had numerous abdominal surgeries, she reports normal bowel movements after onset of abdominal pain, highly unlikely to be bowel obstruction. Patient has no rebound or guarding, no fevers ,chills nausea vomiting, diarrhea; unlikely diverticulitis, diverticulosis, pyelonephritis, urinary tract infection. Patient has no appendix or gallbladder, labs with no significant findings, vital signs are reassuring. Patient has had numerous CT scans recently with no significant findings. No findings on my evaluation today that would necessitate further evaluation or management here in the ED setting. Patient is encouraged to use MiraLAX, plenty of liquids, avoid narcotic pain medication, ibuprofen or Tylenol as needed for pain, follow-up with primary care in 3 days if symptoms persist, return immediately if symptoms worsen. Patient verbalizes understanding and agreement for today's plan and had no further questions or concerns. Patient requests work note.         Eyvonne Mechanic, PA-C 10/02/15 1211  Cathren Laine, MD 10/02/15 [phone]

## 2015-11-05 ENCOUNTER — Encounter (HOSPITAL_BASED_OUTPATIENT_CLINIC_OR_DEPARTMENT_OTHER): Payer: Self-pay | Admitting: Emergency Medicine

## 2015-11-05 ENCOUNTER — Emergency Department (HOSPITAL_BASED_OUTPATIENT_CLINIC_OR_DEPARTMENT_OTHER)
Admission: EM | Admit: 2015-11-05 | Discharge: 2015-11-05 | Disposition: A | Discharge status: Home / Self Care | Payer: 59 | Attending: Emergency Medicine | Admitting: Emergency Medicine

## 2015-11-05 DIAGNOSIS — M545 Low back pain: Secondary | ICD-10-CM | POA: Diagnosis present

## 2015-11-05 DIAGNOSIS — M461 Sacroiliitis, not elsewhere classified: Secondary | ICD-10-CM | POA: Diagnosis not present

## 2015-11-05 DIAGNOSIS — E119 Type 2 diabetes mellitus without complications: Secondary | ICD-10-CM | POA: Insufficient documentation

## 2015-11-05 DIAGNOSIS — Z862 Personal history of diseases of the blood and blood-forming organs and certain disorders involving the immune mechanism: Secondary | ICD-10-CM | POA: Insufficient documentation

## 2015-11-05 DIAGNOSIS — Z79899 Other long term (current) drug therapy: Secondary | ICD-10-CM | POA: Insufficient documentation

## 2015-11-05 DIAGNOSIS — Z8719 Personal history of other diseases of the digestive system: Secondary | ICD-10-CM | POA: Diagnosis not present

## 2015-11-05 DIAGNOSIS — G43909 Migraine, unspecified, not intractable, without status migrainosus: Secondary | ICD-10-CM | POA: Insufficient documentation

## 2015-11-05 DIAGNOSIS — I10 Essential (primary) hypertension: Secondary | ICD-10-CM | POA: Insufficient documentation

## 2015-11-05 DIAGNOSIS — E669 Obesity, unspecified: Secondary | ICD-10-CM | POA: Insufficient documentation

## 2015-11-05 DIAGNOSIS — Z7984 Long term (current) use of oral hypoglycemic drugs: Secondary | ICD-10-CM | POA: Diagnosis not present

## 2015-11-05 MED ORDER — ACETAMINOPHEN-CODEINE #3 300-30 MG PO TABS: 1.0000 | ORAL_TABLET | Freq: Four times a day (QID) | ORAL | Status: DC | PRN

## 2015-11-05 MED ORDER — ORPHENADRINE CITRATE ER 100 MG PO TB12: 100.0000 mg | ORAL_TABLET | Freq: Two times a day (BID) | ORAL | Status: DC

## 2015-11-05 MED ORDER — METHYLPREDNISOLONE 4 MG PO TBPK: ORAL_TABLET | Freq: Four times a day (QID) | ORAL | Status: DC

## 2015-11-05 NOTE — ED Notes (Signed)
Pt reports being treated in past for this back pain, prescribed robaxin with no relief

## 2015-11-05 NOTE — ED Notes (Signed)
States she had increasing pain to back today that made it difficult to walk but has been having lower back muscle pain for > 3 weeks

## 2015-11-05 NOTE — ED Provider Notes (Signed)
CSN: [id number]     Arrival date & time 11/05/15  1710 History  By signing my name below, I, Terrance Branch, attest that this documentation has been prepared under the direction and in the presence of Arby BarretteMarcy Byrl Latin, MD. Electronically Signed: Evon Slackerrance Branch, ED Scribe. 11/05/2015. 7:59 PM.      Chief Complaint  Patient presents with  . Back Pain   Patient is a 44 y.o. female presenting with back pain. The history is provided by the patient. No language interpreter was used.  Back Pain  HPI Comments: Leslie Beaveratalie N Gentry is a 44 y.o. female who presents to the Emergency Department complaining of low back pain onset 3 weeks prior but has recently worsened. Pt doesn't report any associated symptoms. Pt doesn't report any alleviating factors. Pt denies injury or fall. Denies urinary symptoms. Pt reports previously doing physical therapy for her back but has not provided any relief. Pt states she has been prescribed robaxin and mobic with no relief. 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   Past Medical History  Diagnosis Date  . Hypertension   . Diabetes mellitus   . Gallstones   . H/O hiatal hernia   . Headache(784.0)     hx of migraines   . Anemia   . Migraine    Past Surgical History  Procedure Laterality Date  . Abdominal hysterectomy    . Appendectomy    . Tonsillectomy    . Cesarean section    . Cholecystectomy N/A 12/28/2013    Procedure: LAPAROSCOPIC CHOLECYSTECTOMY WITH INTRAOPERATIVE CHOLANGIOGRAM;  Surgeon: Romie LeveeAlicia Thomas, MD;  Location: [facility];  Service: General;  Laterality: N/A;  . Hernia repair     History reviewed. No pertinent family history. Social History  Substance Use Topics  . Smoking status: Never Smoker   . Smokeless tobacco: Never Used  . Alcohol Use: No   OB History    No data available     Review of Systems  Musculoskeletal: Positive for back pain.   10 Systems reviewed and all are negative for acute change except as noted in the  HPI.   Allergies  Chlorhexidine and Erythromycin  Home Medications   Prior to Admission medications   Medication Sig Start Date End Date Taking? Authorizing Provider  amitriptyline (ELAVIL) 10 MG tablet Take 40 mg by mouth at bedtime.   Yes Historical Provider, MD  amLODipine (NORVASC) 10 MG tablet Take 10 mg by mouth daily.   Yes Historical Provider, MD  gabapentin (NEURONTIN) 300 MG capsule Take 300 mg by mouth 3 (three) times daily.   Yes Historical Provider, MD  metFORMIN (GLUCOPHAGE) 1000 MG tablet Take 1,000 mg by mouth 2 (two) times daily with a meal.   Yes Historical Provider, MD  methocarbamol (ROBAXIN) 500 MG tablet Take 500 mg by mouth 4 (four) times daily.   Yes Historical Provider, MD  acetaminophen-codeine (TYLENOL #3) 300-30 MG tablet Take 1-2 tablets by mouth every 6 (six) hours as needed for moderate pain. 11/05/15   Arby BarretteMarcy Franco Duley, MD  loratadine (CLARITIN) 10 MG tablet Take 1 tablet (10 mg total) by mouth daily. 10/18/14   Loren Raceravid Yelverton, MD  methylPREDNISolone (MEDROL DOSEPAK) 4 MG TBPK tablet Take by mouth taper from 4 doses each day to 1 dose and stop. 11/05/15   Arby BarretteMarcy Shanik Brookshire, MD  orphenadrine (NORFLEX) 100 MG tablet Take 1 tablet (100 mg total) by mouth 2 (two) times daily. 11/05/15   Arby BarretteMarcy Athelene Hursey, MD   BP 130/93 mmHg  Pulse 98  Temp(Src) 98.1 F (36.7 C) (Oral)  Resp 20  Ht  (1.651 m)  Wt 229 lb (103.874 kg)  BMI 38.11 kg/m2  SpO2 100%   Physical Exam  Constitutional: She is oriented to person, place, and time.  Patient is moderately obese. She is alert and nontoxic. She is ambulatory about the room without gait limitation.  HENT:  Head: Normocephalic and atraumatic.  Eyes: EOM are normal.  Neck: Neck supple.  Cardiovascular: Normal rate, regular rhythm, normal heart sounds and intact distal pulses.   Pulmonary/Chest: Effort normal and breath sounds normal.  Abdominal: Soft. Bowel sounds are normal. She exhibits no distension. There is no  tenderness.  Musculoskeletal: Normal range of motion. She exhibits no edema.  Patient localizes pain to her sacroiliac joint on the right. She does not have significant limitation for range of motion. She is able to ambulate appropriately. No extremity weakness.  Neurological: She is alert and oriented to person, place, and time. She has normal strength. Coordination normal. GCS eye subscore is 4. GCS verbal subscore is 5. GCS motor subscore is 6.  Skin: Skin is warm, dry and intact.  Psychiatric: She has a normal mood and affect.    ED Course  Procedures (including critical care time) DIAGNOSTIC STUDIES: Oxygen Saturation is 100% on RA, normal by my interpretation.    COORDINATION OF CARE: 7:59 PM-Discussed treatment plan with pt at bedside and pt agreed to plan.     Labs Review Labs Reviewed - No data to display  Imaging Review No results found.    EKG Interpretation None      MDM   Final diagnoses:  Sacroiliitis [facility])   Patient has undergone treatment for similar back pain. She denies getting relief with physical therapy or medications used date. She is advised that she will need to follow-up with orthopedics again for ongoing management. She is given a Solu-Medrol Dosepak, Norflex and Tylenol 3 for pain control. She is given information for follow-up with sports medicine for management of chronic lower back pain.     Arby Barrette, MD 11/05/15 2001

## 2015-11-05 NOTE — Discharge Instructions (Signed)

## 2015-11-14 ENCOUNTER — Encounter (INDEPENDENT_AMBULATORY_CARE_PROVIDER_SITE_OTHER): Payer: Self-pay

## 2015-11-14 ENCOUNTER — Ambulatory Visit (INDEPENDENT_AMBULATORY_CARE_PROVIDER_SITE_OTHER): Payer: 59 | Admitting: Family Medicine

## 2015-11-14 ENCOUNTER — Encounter: Payer: Self-pay | Admitting: Family Medicine

## 2015-11-14 VITALS — BP 134/91 | HR 90 | Ht 65.0 in | Wt 220.0 lb

## 2015-11-14 DIAGNOSIS — M545 Low back pain, unspecified: Secondary | ICD-10-CM

## 2015-11-14 MED ORDER — DIAZEPAM 5 MG PO TABS: ORAL_TABLET | ORAL | Status: DC

## 2015-11-14 MED ORDER — OXYCODONE-ACETAMINOPHEN 5-325 MG PO TABS: 1.0000 | ORAL_TABLET | Freq: Four times a day (QID) | ORAL | Status: DC | PRN

## 2015-11-14 NOTE — Patient Instructions (Signed)
You most likely have a lumbar disc herniation vs severe facet arthropathy of your low back. Percocet as needed for severe pain (no driving on this, we do not refill this) Given you have tried prednisone, muscle relaxants, anti-inflammatories, physical therapy without relief I'd go ahead with an MRI of the lumbar spine at this point. We will call you with the results and next steps (likely an injection). Take valium as directed prior to the MRI.

## 2015-11-16 DIAGNOSIS — M545 Low back pain, unspecified: Secondary | ICD-10-CM | POA: Insufficient documentation

## 2015-11-16 NOTE — Assessment & Plan Note (Signed)
patient has done extensive conservative measures including physical therapy, nsaids, muscle relaxants, prednisone without benefit.  Still with 10/10 pain.  Advised at this point we go forward with an MRI of lumbar spine to assess for disc herniation.

## 2015-11-16 NOTE — Progress Notes (Addendum)
PCP: Pcp Not In System  Subjective:   HPI: Patient is a 44 y.o. female here for low back pain.  Patient reports she's had at least 6 months of low back pain. Denies radiation into legs. No numbness or tingling. Pain is constant, 10/10 level, sharp. Prior radiographs showed only arthritis. Worse getting up from a seated position. Previously did physical therapy, tried robaxin, mobic, prednisone, norflex, tylenol #3. 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 No improvement with above measures. Has not had MRI before. No skin changes, fever, other complaints.  Past Medical History  Diagnosis Date  . Hypertension   . Diabetes mellitus   . Gallstones   . H/O hiatal hernia   . Headache(784.0)     hx of migraines   . Anemia   . Migraine     Current Outpatient Prescriptions on File Prior to Visit  Medication Sig Dispense Refill  . amitriptyline (ELAVIL) 10 MG tablet Take 40 mg by mouth at bedtime.    Marland Kitchen. amLODipine (NORVASC) 10 MG tablet Take 10 mg by mouth daily.    Marland Kitchen. gabapentin (NEURONTIN) 300 MG capsule Take 300 mg by mouth 3 (three) times daily.    Marland Kitchen. loratadine (CLARITIN) 10 MG tablet Take 1 tablet (10 mg total) by mouth daily. 30 tablet 0  . metFORMIN (GLUCOPHAGE) 1000 MG tablet Take 1,000 mg by mouth 2 (two) times daily with a meal.    . methocarbamol (ROBAXIN) 500 MG tablet Take 500 mg by mouth 4 (four) times daily.    . orphenadrine (NORFLEX) 100 MG tablet Take 1 tablet (100 mg total) by mouth 2 (two) times daily. 30 tablet 0   No current facility-administered medications on file prior to visit.    Past Surgical History  Procedure Laterality Date  . Abdominal hysterectomy    . Appendectomy    . Tonsillectomy    . Cesarean section    . Cholecystectomy N/A 12/28/2013    Procedure: LAPAROSCOPIC CHOLECYSTECTOMY WITH INTRAOPERATIVE CHOLANGIOGRAM;  Surgeon: Romie LeveeAlicia Thomas, MD;  Location: [facility];  Service: General;  Laterality: N/A;  . Hernia repair      Allergies  Allergen  Reactions  . Ketorolac Itching  . Chlorhexidine Swelling  . Erythromycin Hives  . Tramadol Itching    Social History   Social History  . Marital Status: Single    Spouse Name: N/A  . Number of Children: N/A  . Years of Education: N/A   Occupational History  . Not on file.   Social History Main Topics  . Smoking status: Never Smoker   . Smokeless tobacco: Never Used  . Alcohol Use: No  . Drug Use: No  . Sexual Activity: Not on file   Other Topics Concern  . Not on file   Social History Narrative    No family history on file.  BP 134/91 mmHg  Pulse 90  Ht 5\' 5"  (1.651 m)  Wt 220 lb (99.791 kg)  BMI 36.61 kg/m2  Review of Systems: See HPI above.    Objective:  Physical Exam:  Gen: NAD  Back: No gross deformity, scoliosis. TTP right lumbar paraspinal region.  No midline or bony TTP. FROM with pain on flexion and extension. Strength LEs 5/5 all muscle groups.   2+ MSRs in patellar and achilles tendons, equal bilaterally. Negative SLRs. Sensation intact to light touch bilaterally.  Right hip: Negative logroll bilateral hips Negative fabers and piriformis stretches.    Assessment & Plan:  1. Low back pain -  patient has done extensive conservative measures including physical therapy, nsaids, muscle relaxants, prednisone without benefit.  Still with 10/10 pain.  Advised at this point we go forward with an MRI of lumbar spine to assess for disc herniation.    Addendum: MRI reviewed and discussed with patient.  No changes in MRI to account for her severity of pain or to warrant ESIs, neurosurgery referral.  Discussed her options and will go ahead with pain management referral for further evaluation and treatment.

## 2015-11-20 ENCOUNTER — Telehealth: Payer: Self-pay | Admitting: Family Medicine

## 2015-11-20 NOTE — Telephone Encounter (Signed)
Spoke to patient and told her someone from the radiology department would contact her to schedule appointment.

## 2015-12-02 ENCOUNTER — Ambulatory Visit (HOSPITAL_BASED_OUTPATIENT_CLINIC_OR_DEPARTMENT_OTHER)
Admission: RE | Admit: 2015-12-02 | Discharge: 2015-12-02 | Disposition: A | Discharge status: Home / Self Care | Payer: 59 | Source: Ambulatory Visit | Attending: Family Medicine | Admitting: Family Medicine

## 2015-12-02 DIAGNOSIS — M8938 Hypertrophy of bone, other site: Secondary | ICD-10-CM | POA: Diagnosis not present

## 2015-12-02 DIAGNOSIS — M4316 Spondylolisthesis, lumbar region: Secondary | ICD-10-CM | POA: Insufficient documentation

## 2015-12-02 DIAGNOSIS — M545 Low back pain, unspecified: Secondary | ICD-10-CM

## 2015-12-05 ENCOUNTER — Telehealth: Payer: Self-pay | Admitting: Family Medicine

## 2015-12-06 NOTE — Telephone Encounter (Signed)
Discussed this on the phone with her yesterday - will put addendum to her note.

## 2015-12-26 ENCOUNTER — Telehealth: Payer: Self-pay | Admitting: Family Medicine

## 2015-12-28 ENCOUNTER — Telehealth: Payer: Self-pay | Admitting: Family Medicine

## 2015-12-28 NOTE — Telephone Encounter (Signed)
Spoke to patient to let her know that referral was sent in and she would be contacted.

## 2016-01-24 ENCOUNTER — Encounter (HOSPITAL_BASED_OUTPATIENT_CLINIC_OR_DEPARTMENT_OTHER): Payer: Self-pay

## 2016-01-24 ENCOUNTER — Emergency Department (HOSPITAL_BASED_OUTPATIENT_CLINIC_OR_DEPARTMENT_OTHER)
Admission: EM | Admit: 2016-01-24 | Discharge: 2016-01-24 | Disposition: A | Discharge status: Home / Self Care | Payer: 59 | Attending: Emergency Medicine | Admitting: Emergency Medicine

## 2016-01-24 DIAGNOSIS — Z7984 Long term (current) use of oral hypoglycemic drugs: Secondary | ICD-10-CM | POA: Diagnosis not present

## 2016-01-24 DIAGNOSIS — Z8719 Personal history of other diseases of the digestive system: Secondary | ICD-10-CM | POA: Diagnosis not present

## 2016-01-24 DIAGNOSIS — Z862 Personal history of diseases of the blood and blood-forming organs and certain disorders involving the immune mechanism: Secondary | ICD-10-CM | POA: Insufficient documentation

## 2016-01-24 DIAGNOSIS — N390 Urinary tract infection, site not specified: Secondary | ICD-10-CM | POA: Insufficient documentation

## 2016-01-24 DIAGNOSIS — I10 Essential (primary) hypertension: Secondary | ICD-10-CM | POA: Diagnosis not present

## 2016-01-24 DIAGNOSIS — Z3202 Encounter for pregnancy test, result negative: Secondary | ICD-10-CM | POA: Insufficient documentation

## 2016-01-24 DIAGNOSIS — Z79899 Other long term (current) drug therapy: Secondary | ICD-10-CM | POA: Diagnosis not present

## 2016-01-24 DIAGNOSIS — E119 Type 2 diabetes mellitus without complications: Secondary | ICD-10-CM | POA: Insufficient documentation

## 2016-01-24 DIAGNOSIS — R350 Frequency of micturition: Secondary | ICD-10-CM | POA: Diagnosis present

## 2016-01-24 DIAGNOSIS — R102 Pelvic and perineal pain: Secondary | ICD-10-CM

## 2016-01-24 LAB — URINE MICROSCOPIC-ADD ON: RBC / HPF: NONE SEEN RBC/hpf (ref 0–5)

## 2016-01-24 LAB — URINALYSIS, ROUTINE W REFLEX MICROSCOPIC
Bilirubin Urine: NEGATIVE
GLUCOSE, UA: NEGATIVE mg/dL
Hgb urine dipstick: NEGATIVE
Ketones, ur: NEGATIVE mg/dL
Nitrite: NEGATIVE
PROTEIN: NEGATIVE mg/dL
SPECIFIC GRAVITY, URINE: 1.028 (ref 1.005–1.030)
pH: 6 (ref 5.0–8.0)

## 2016-01-24 LAB — WET PREP, GENITAL
SPERM: NONE SEEN
Trich, Wet Prep: NONE SEEN
YEAST WET PREP: NONE SEEN

## 2016-01-24 LAB — PREGNANCY, URINE: Preg Test, Ur: NEGATIVE

## 2016-01-24 MED ORDER — CEPHALEXIN 500 MG PO CAPS: 500.0000 mg | ORAL_CAPSULE | Freq: Two times a day (BID) | ORAL | Status: DC

## 2016-01-24 MED ORDER — CEPHALEXIN 250 MG PO CAPS
500.0000 mg | ORAL_CAPSULE | Freq: Once | ORAL | Status: AC
  Administered 2016-01-24: 500 mg via ORAL
  Filled 2016-01-24: qty 2

## 2016-01-24 NOTE — ED Provider Notes (Signed)
CSN: [id number]     Arrival date & time 01/24/16  1716 History   First MD Initiated Contact with Patient 01/24/16 1802     Chief Complaint  Patient presents with  . Abdominal Pain     (Consider location/radiation/quality/duration/timing/severity/associated sxs/prior Treatment) HPI   Blood pressure 150/102, pulse 84, temperature 98.3 F (36.8 C), temperature source Oral, resp. rate 18, height  (1.651 m), weight 103.42 kg, SpO2 99 %.  Leslie Gentry is a 45 y.o. female past medical history significant for hypertension, non-insulin-dependent diabetes complaining of vaginal pressure and urinary frequency worsening over the course of 5 days. Dates that vaginal pressure is constant, not associated with urination and is exacerbated by standing. Patient denies abdominal pain (note that this contradicts triage note), fever, chills, nausea, vomiting, vaginal discharge, change in bowel movements. States that she is passing flatus normally. She's been pregnant 4 times, does not have a OB/GYN that she follows with regularly.   Past Medical History  Diagnosis Date  . Hypertension   . Diabetes mellitus   . Gallstones   . H/O hiatal hernia   . Headache(784.0)     hx of migraines   . Anemia   . Migraine    Past Surgical History  Procedure Laterality Date  . Abdominal hysterectomy    . Appendectomy    . Tonsillectomy    . Cesarean section    . Cholecystectomy N/A 12/28/2013    Procedure: LAPAROSCOPIC CHOLECYSTECTOMY WITH INTRAOPERATIVE CHOLANGIOGRAM;  Surgeon: Romie Levee, MD;  Location: [facility];  Service: General;  Laterality: N/A;  . Hernia repair     No family history on file. Social History  Substance Use Topics  . Smoking status: Never Smoker   . Smokeless tobacco: Never Used  . Alcohol Use: No   OB History    No data available     Review of Systems  10 systems reviewed and found to be negative, except as noted in the HPI.   Allergies  Ketorolac; Chlorhexidine;  Erythromycin; and Tramadol  Home Medications   Prior to Admission medications   Medication Sig Start Date End Date Taking? Authorizing Provider  amitriptyline (ELAVIL) 10 MG tablet Take 40 mg by mouth at bedtime.    Historical Provider, MD  amLODipine (NORVASC) 10 MG tablet Take 10 mg by mouth daily.    Historical Provider, MD  cephALEXin (KEFLEX) 500 MG capsule Take 1 capsule (500 mg total) by mouth 2 (two) times daily. 01/24/16   Brayant Dorr, PA-C  gabapentin (NEURONTIN) 300 MG capsule Take 300 mg by mouth 3 (three) times daily.    Historical Provider, MD  metFORMIN (GLUCOPHAGE) 1000 MG tablet Take 1,000 mg by mouth 2 (two) times daily with a meal.    Historical Provider, MD  methocarbamol (ROBAXIN) 500 MG tablet Take 500 mg by mouth 4 (four) times daily.    Historical Provider, MD   BP 150/102 mmHg  Pulse 84  Temp(Src) 98.3 F (36.8 C) (Oral)  Resp 18  Ht  (1.651 m)  Wt 103.42 kg  BMI 37.94 kg/m2  SpO2 99% Physical Exam  Constitutional: She is oriented to person, place, and time. She appears well-developed and well-nourished. No distress.  HENT:  Head: Normocephalic and atraumatic.  Mouth/Throat: Oropharynx is clear and moist.  Eyes: Conjunctivae and EOM are normal. Pupils are equal, round, and reactive to light.  Neck: Normal range of motion.  Cardiovascular: Normal rate, regular rhythm and intact distal pulses.   Pulmonary/Chest: Effort normal  and breath sounds normal. No stridor.  Abdominal: Soft. There is no tenderness.  Genitourinary:  GYN exam a chaperoned by nurse Maralyn Sago, no rashes or lesions, no gross prolapse. No abnormal discharge, no cervical or adnexal tenderness  Musculoskeletal: Normal range of motion.  Neurological: She is alert and oriented to person, place, and time.  Skin: She is not diaphoretic.  Psychiatric: She has a normal mood and affect.  Nursing note and vitals reviewed.   ED Course  Procedures (including critical care time) Labs  Review Labs Reviewed  WET PREP, GENITAL - Abnormal; Notable for the following:    Clue Cells Wet Prep HPF POC PRESENT (*)    WBC, Wet Prep HPF POC MODERATE (*)    All other components within normal limits  URINALYSIS, ROUTINE W REFLEX MICROSCOPIC (NOT AT [facility]) - Abnormal; Notable for the following:    APPearance CLOUDY (*)    Leukocytes, UA SMALL (*)    All other components within normal limits  URINE MICROSCOPIC-ADD ON - Abnormal; Notable for the following:    Squamous Epithelial / LPF 6-30 (*)    Bacteria, UA FEW (*)    All other components within normal limits  URINE CULTURE  PREGNANCY, URINE  GC/CHLAMYDIA PROBE AMP ([place]) NOT AT [facility]    Imaging Review No results found. I have personally reviewed and evaluated these images and lab results as part of my medical decision-making.   EKG Interpretation None      MDM   Final diagnoses:  UTI (lower urinary tract infection)  Pelvic pressure in female    Filed Vitals:   01/24/16 1726  BP: 150/102  Pulse: 84  Temp: 98.3 F (36.8 C)  TempSrc: Oral  Resp: 18  Height:  (1.651 m)  Weight: 103.42 kg  SpO2: 99%    Medications  cephALEXin (KEFLEX) capsule 500 mg (not administered)    Leslie Gentry is 45 y.o. female presenting with gaginal/pelvic pressure worsening over the course of 4 days also associated with urinary frequency. Patient has benign abdominal exam, contaminated urine might be infected will start her on antibiotics and culture. Pelvic exam with no gross prolapse. She has had 4 pregnancies, will refer to OB/GYN for further examination, I've also advised her to follow closely with primary care physician. Prep with moderate blood cells and clue cells over patient denies abnormal vaginal discharge.  Evaluation does not show pathology that would require ongoing emergent intervention or inpatient treatment. Pt is hemodynamically stable and mentating appropriately. Discussed findings and plan with  patient/guardian, who agrees with care plan. All questions answered. Return precautions discussed and outpatient follow up given.   New Prescriptions   CEPHALEXIN (KEFLEX) 500 MG CAPSULE    Take 1 capsule (500 mg total) by mouth 2 (two) times daily.        Wynetta Emery, PA-C 01/24/16 1956  Melene Plan, DO 01/25/16 1408

## 2016-01-24 NOTE — Discharge Instructions (Signed)
Please follow with your primary care doctor in the next 2 days for a check-up. They must obtain records for further management.  ° °Do not hesitate to return to the Emergency Department for any new, worsening or concerning symptoms.  ° °

## 2016-01-24 NOTE — ED Notes (Signed)
C/o abd pain, vaginal pressure and urinary freq since last Thursday-denies n/v/d, vaginal d/c

## 2016-01-24 NOTE — ED Notes (Signed)
Pt verbalizes understanding of d/c instructions and denies any further needs at this time. 

## 2016-01-25 LAB — GC/CHLAMYDIA PROBE AMP (~~LOC~~) NOT AT ARMC
Chlamydia: NEGATIVE
Neisseria Gonorrhea: NEGATIVE

## 2016-01-26 LAB — URINE CULTURE

## 2016-01-29 ENCOUNTER — Telehealth (HOSPITAL_COMMUNITY): Payer: Self-pay

## 2016-01-29 NOTE — Telephone Encounter (Signed)
Post ED Visit - Positive Culture Follow-up  Culture report reviewed by antimicrobial stewardship pharmacist:   Enzo Bi, Pharm.D.  Celedonio Miyamoto, Pharm.D., BCPS  Garvin Fila, Pharm.D.  Georgina Pillion, Pharm.D., BCPS  [place], [address].D., BCPS, AAHIVP  Estella Husk, Pharm.D., BCPS, AAHIVP  Tennis Must, Pharm.D.  Sherle Poe, [place].D.  Positive urine culture, >/= 100,000 colonies -> E Coli Treated with Cephalexin, organism sensitive to the same and no further patient follow-up is required at this time.  Arvid Right 01/29/2016, 3:41 AM

## 2016-02-08 NOTE — Telephone Encounter (Signed)
Finished

## 2016-05-27 ENCOUNTER — Encounter (HOSPITAL_BASED_OUTPATIENT_CLINIC_OR_DEPARTMENT_OTHER): Payer: Self-pay | Admitting: *Deleted

## 2016-05-27 ENCOUNTER — Emergency Department (HOSPITAL_BASED_OUTPATIENT_CLINIC_OR_DEPARTMENT_OTHER)
Admission: EM | Admit: 2016-05-27 | Discharge: 2016-05-27 | Disposition: A | Discharge status: Home / Self Care | Payer: 59 | Attending: Emergency Medicine | Admitting: Emergency Medicine

## 2016-05-27 DIAGNOSIS — B029 Zoster without complications: Secondary | ICD-10-CM | POA: Diagnosis not present

## 2016-05-27 DIAGNOSIS — M79672 Pain in left foot: Secondary | ICD-10-CM | POA: Diagnosis present

## 2016-05-27 DIAGNOSIS — Z79899 Other long term (current) drug therapy: Secondary | ICD-10-CM | POA: Insufficient documentation

## 2016-05-27 DIAGNOSIS — I10 Essential (primary) hypertension: Secondary | ICD-10-CM | POA: Insufficient documentation

## 2016-05-27 DIAGNOSIS — E119 Type 2 diabetes mellitus without complications: Secondary | ICD-10-CM | POA: Insufficient documentation

## 2016-05-27 DIAGNOSIS — Z7984 Long term (current) use of oral hypoglycemic drugs: Secondary | ICD-10-CM | POA: Diagnosis not present

## 2016-05-27 MED ORDER — HYDROCODONE-ACETAMINOPHEN 5-325 MG PO TABS
1.0000 | ORAL_TABLET | Freq: Once | ORAL | Status: AC
  Administered 2016-05-27: 1 via ORAL
  Filled 2016-05-27: qty 1

## 2016-05-27 MED ORDER — HYDROCORTISONE 2.5 % EX LOTN: TOPICAL_LOTION | Freq: Two times a day (BID) | CUTANEOUS | Status: DC

## 2016-05-27 MED ORDER — ACYCLOVIR 400 MG PO TABS: 800.0000 mg | ORAL_TABLET | Freq: Every day | ORAL | Status: DC

## 2016-05-27 NOTE — ED Notes (Signed)
Pa  at bedside. 

## 2016-05-27 NOTE — ED Notes (Signed)
Pt c/o lateral left foot pain with blisters x 1 week HX shingles

## 2016-05-27 NOTE — ED Provider Notes (Signed)
CSN: [id number]     Arrival date & time 05/27/16  1013 History   First MD Initiated Contact with Patient 05/27/16 1027     Chief Complaint  Patient presents with  . Foot Pain     (Consider location/radiation/quality/duration/timing/severity/associated sxs/prior Treatment) The history is provided by the patient and medical records. No language interpreter was used.     Leslie Gentry is a 45 y.o. female  with a hx of Hypertension, diabetes presents to the Emergency Department complaining of gradual, persistent, progressively worsening left foot pain with blisters onset approximately 1 week ago. Patient reports a history of shingles in the same area.  Patient reports that this rash appears when she becomes very stressed. She reports a recent physical with her primary care physician who did blood work.  Record review shows that patient did have yearly exam and blood work on 05/07/2016.  A1c was elevated to 7.9 but otherwise lab work was unremarkable.  Record review also shows last episode of shingles was July 2015 and patient was treated with valacyclovir.  On repeat visits etiology was unclear shingles versus dyshidrotic eczema. Of note patient with positive RA titer last year. She reports she is on a waiting list to see rheumatology.  Patient reports good control of her blood sugars at home.  She reports compliance with her medications including her diabetes medications. She denies fevers or chills, nausea or vomiting. She denies other rashes or other site of the same rash. She denies history which would put her at risk for HIV or syphilis.  Patient has tried warm soaks at home and "itching cream" without relief.  She reports the rash is so painful it is difficult to walk. Anything touching the rash makes it worse.   Past Medical History  Diagnosis Date  . Hypertension   . Diabetes mellitus   . Gallstones   . H/O hiatal hernia   . Headache(784.0)     hx of migraines   . Anemia   .  Migraine    Past Surgical History  Procedure Laterality Date  . Abdominal hysterectomy    . Appendectomy    . Tonsillectomy    . Cesarean section    . Cholecystectomy N/A 12/28/2013    Procedure: LAPAROSCOPIC CHOLECYSTECTOMY WITH INTRAOPERATIVE CHOLANGIOGRAM;  Surgeon: Romie Levee, MD;  Location: [facility];  Service: General;  Laterality: N/A;  . Hernia repair     History reviewed. No pertinent family history. Social History  Substance Use Topics  . Smoking status: Never Smoker   . Smokeless tobacco: Never Used  . Alcohol Use: No   OB History    No data available     Review of Systems  Constitutional: Negative for fever, diaphoresis, appetite change, fatigue and unexpected weight change.  HENT: Negative for mouth sores.   Eyes: Negative for visual disturbance.  Respiratory: Negative for cough, chest tightness, shortness of breath and wheezing.   Cardiovascular: Negative for chest pain.  Gastrointestinal: Negative for nausea, vomiting, abdominal pain, diarrhea and constipation.  Endocrine: Negative for polydipsia, polyphagia and polyuria.  Genitourinary: Negative for dysuria, urgency, frequency and hematuria.  Musculoskeletal: Positive for gait problem (2/2 pain in the foot). Negative for back pain and neck stiffness.  Skin: Positive for rash.  Allergic/Immunologic: Negative for immunocompromised state.  Neurological: Negative for syncope, light-headedness and headaches.  Hematological: Does not bruise/bleed easily.  Psychiatric/Behavioral: Negative for sleep disturbance. The patient is not nervous/anxious.       Allergies  Ketorolac; Chlorhexidine; Erythromycin;  and Tramadol  Home Medications   Prior to Admission medications   Medication Sig Start Date End Date Taking? Authorizing Provider  acyclovir (ZOVIRAX) 400 MG tablet Take 2 tablets (800 mg total) by mouth 5 (five) times daily. 05/27/16   Jaree Trinka, PA-C  amitriptyline (ELAVIL) 10 MG tablet Take 40 mg  by mouth at bedtime.    Historical Provider, MD  amLODipine (NORVASC) 10 MG tablet Take 10 mg by mouth daily.    Historical Provider, MD  gabapentin (NEURONTIN) 300 MG capsule Take 300 mg by mouth 3 (three) times daily.    Historical Provider, MD  hydrocortisone 2.5 % lotion Apply topically 2 (two) times daily. 05/27/16   Johanny Segers, PA-C  metFORMIN (GLUCOPHAGE) 1000 MG tablet Take 1,000 mg by mouth 2 (two) times daily with a meal.    Historical Provider, MD  methocarbamol (ROBAXIN) 500 MG tablet Take 500 mg by mouth 4 (four) times daily.    Historical Provider, MD   BP 135/93 mmHg  Pulse 91  Temp(Src) 98.8 F (37.1 C) (Oral)  Resp 18  Ht 5\' 5"  (1.651 m)  Wt 103.42 kg  BMI 37.94 kg/m2  SpO2 100% Physical Exam  Constitutional: She appears well-developed and well-nourished. No distress.  HENT:  Head: Normocephalic and atraumatic.  No swelling of the uvula or oropharynx   Eyes: Conjunctivae are normal.  Neck: Normal range of motion.  Patent airway No stridor; normal phonation Handling secretions without difficulty  Cardiovascular: Normal rate and intact distal pulses.   No murmur heard. Pulmonary/Chest: Effort normal. No stridor. No respiratory distress.  No wheezes or rhonchi  Abdominal: Soft. There is no tenderness.  Musculoskeletal: Normal range of motion. She exhibits no edema.  Neurological: She is alert.  Skin: Skin is warm and dry. Rash noted. She is not diaphoretic.  Erythematous and vesicular rash located on the medial plantar surface of the left foot with significant tenderness to palpation No other lesions located on the palms or soles of the feet No other skin rash  Psychiatric: She has a normal mood and affect.  Nursing note and vitals reviewed.        ED Course  Procedures (including critical care time)   MDM   Final diagnoses:  Shingles rash   Leslie Gentry presents with rash to the left foot. Rash is most consistent with shingles.  Patient will be given acyclovir.  It has also been treated as dyshidrotic eczema in the past. Discussed less likelihood of this as patient has no other rash to her hands or feet and rash reoccurs in the exact same location every time. Patient denies history of immunosuppression however record review shows a history of positive RA titer.  Will treat with acyclovir.  We'll also give hydrocortisone lotion.  Recommend further testing including dermatology evaluation of the rash. Discussed reasons to return to the emergency department including evidence of secondary infection. Patient's recent blood work showed normal blood sugars and patient reports good control at home.  No evidence of secondary infection at this time.  Discussed findings with patient and significant other at bedside. Both state understanding and are in agreement. Patient reports she will follow-up with dermatology as soon as possible.  Dahlia ClientHannah Daniyah Fohl, PA-C 05/27/16 1129  Jacalyn LefevreJulie Haviland, MD 05/28/16 [phone]

## 2016-05-27 NOTE — Discharge Instructions (Signed)
1. Medications: acyclovir, hydrocortisone, usual home medications 2. Treatment: rest, drink plenty of fluids,  3. Follow Up: Please followup with your primary doctor in 2-3 days for discussion of your diagnoses and further evaluation after today's visit; if you do not have a primary care doctor use the resource guide provided to find one; Please return to the ER for worsening symptoms, fevers, evidence of secondary infection   Shingles Shingles, which is also known as herpes zoster, is an infection that causes a painful skin rash and fluid-filled blisters. Shingles is not related to genital herpes, which is a sexually transmitted infection.   Shingles only develops in people who:  Have had chickenpox.  Have received the chickenpox vaccine. (This is rare.) CAUSES Shingles is caused by varicella-zoster virus (VZV). This is the same virus that causes chickenpox. After exposure to VZV, the virus stays in the body in an inactive (dormant) state. Shingles develops if the virus reactivates. This can happen many years after the initial exposure to VZV. It is not known what causes this virus to reactivate. RISK FACTORS People who have had chickenpox or received the chickenpox vaccine are at risk for shingles. Infection is more common in people who:  Are older than age 43.  Have a weakened defense (immune) system, such as those with HIV, AIDS, or cancer.  Are taking medicines that weaken the immune system, such as transplant medicines.  Are under great stress. SYMPTOMS Early symptoms of this condition include itching, tingling, and pain in an area on your skin. Pain may be described as burning, stabbing, or throbbing. A few days or weeks after symptoms start, a painful red rash appears, usually on one side of the body in a bandlike or beltlike pattern. The rash eventually turns into fluid-filled blisters that break open, scab over, and dry up in about 2-3 weeks. At any time during the infection,  you may also develop:  A fever.  Chills.  A headache.  An upset stomach. DIAGNOSIS This condition is diagnosed with a skin exam. Sometimes, skin or fluid samples are taken from the blisters before a diagnosis is made. These samples are examined under a microscope or sent to a lab for testing. TREATMENT There is no specific cure for this condition. Your health care provider will probably prescribe medicines to help you manage pain, recover more quickly, and avoid long-term problems. Medicines may include:  Antiviral drugs.  Anti-inflammatory drugs.  Pain medicines. If the area involved is on your face, you may be referred to a specialist, such as an eye doctor (ophthalmologist) or an ear, nose, and throat (ENT) doctor to help you avoid eye problems, chronic pain, or disability. HOME CARE INSTRUCTIONS Medicines  Take medicines only as directed by your health care provider.  Apply an anti-itch or numbing cream to the affected area as directed by your health care provider. Blister and Rash Care  Take a cool bath or apply cool compresses to the area of the rash or blisters as directed by your health care provider. This may help with pain and itching.  Keep your rash covered with a loose bandage (dressing). Wear loose-fitting clothing to help ease the pain of material rubbing against the rash.  Keep your rash and blisters clean with mild soap and cool water or as directed by your health care provider.  Check your rash every day for signs of infection. These include redness, swelling, and pain that lasts or increases.  Do not pick your blisters.  Do not  scratch your rash. General Instructions  Rest as directed by your health care provider.  Keep all follow-up visits as directed by your health care provider. This is important.  Until your blisters scab over, your infection can cause chickenpox in people who have never had it or been vaccinated against it. To prevent this from  happening, avoid contact with other people, especially:  Babies.  Pregnant women.  Children who have eczema.  Elderly people who have transplants.  People who have chronic illnesses, such as leukemia or AIDS. SEEK MEDICAL CARE IF:  Your pain is not relieved with prescribed medicines.  Your pain does not get better after the rash heals.  Your rash looks infected. Signs of infection include redness, swelling, and pain that lasts or increases. SEEK IMMEDIATE MEDICAL CARE IF:  The rash is on your face or nose.  You have facial pain, pain around your eye area, or loss of feeling on one side of your face.  You have ear pain or you have ringing in your ear.  You have loss of taste.  Your condition gets worse.   This information is not intended to replace advice given to you by your health care provider. Make sure you discuss any questions you have with your health care provider.   Document Released: 12/16/2005 Document Revised: 01/06/2015 Document Reviewed: 10/27/2014 Elsevier Interactive Patient Education [employer]

## 2016-06-23 ENCOUNTER — Emergency Department (HOSPITAL_BASED_OUTPATIENT_CLINIC_OR_DEPARTMENT_OTHER): Payer: 59

## 2016-06-23 ENCOUNTER — Encounter (HOSPITAL_BASED_OUTPATIENT_CLINIC_OR_DEPARTMENT_OTHER): Payer: Self-pay | Admitting: Emergency Medicine

## 2016-06-23 ENCOUNTER — Emergency Department (HOSPITAL_BASED_OUTPATIENT_CLINIC_OR_DEPARTMENT_OTHER)
Admission: EM | Admit: 2016-06-23 | Discharge: 2016-06-23 | Disposition: A | Discharge status: Home / Self Care | Payer: 59 | Attending: Emergency Medicine | Admitting: Emergency Medicine

## 2016-06-23 DIAGNOSIS — Z79899 Other long term (current) drug therapy: Secondary | ICD-10-CM | POA: Diagnosis not present

## 2016-06-23 DIAGNOSIS — E119 Type 2 diabetes mellitus without complications: Secondary | ICD-10-CM | POA: Insufficient documentation

## 2016-06-23 DIAGNOSIS — M25562 Pain in left knee: Secondary | ICD-10-CM | POA: Diagnosis not present

## 2016-06-23 DIAGNOSIS — Z7984 Long term (current) use of oral hypoglycemic drugs: Secondary | ICD-10-CM | POA: Insufficient documentation

## 2016-06-23 DIAGNOSIS — I10 Essential (primary) hypertension: Secondary | ICD-10-CM | POA: Diagnosis not present

## 2016-06-23 MED ORDER — OXYCODONE-ACETAMINOPHEN 5-325 MG PO TABS
1.0000 | ORAL_TABLET | Freq: Once | ORAL | Status: AC
  Administered 2016-06-23: 1 via ORAL
  Filled 2016-06-23: qty 1

## 2016-06-23 MED ORDER — OXYCODONE-ACETAMINOPHEN 5-325 MG PO TABS: 1.0000 | ORAL_TABLET | ORAL | Status: DC | PRN

## 2016-06-23 NOTE — Discharge Instructions (Signed)
Take your medications as needed for pain relief. I recommend resting, elevating and applying ice to her left knee for 15-20 minutes 3-4 times daily to help with pain and swelling. I recommend falling up with the orthopedic clinic listed above if your pain and swelling have improved over the next week. Return to the emergency department if symptoms worsen or new onset of fever, redness, swelling, warmth, numbness, tingling, weakness.

## 2016-06-23 NOTE — ED Notes (Signed)
Pt reports L knee pain and swelling x 1 week. Pt denies injury. States she normally has had similar symptoms in the R knee but this is new for the left. Pt reports taking hydrocodone daily for chronic back pain and states this is not alleviating the knee pain. Pt has not had this yet today. Swelling noted to L knee, +pulse +cms.

## 2016-06-23 NOTE — ED Provider Notes (Signed)
CSN: [id number]     Arrival date & time 06/23/16  1107 History   First MD Initiated Contact with Patient 06/23/16 1122     Chief Complaint  Patient presents with  . Knee Pain     (Consider location/radiation/quality/duration/timing/severity/associated sxs/prior Treatment) HPI   Patient is a 45 year old female with past medical history of hypertension and diabetes who presents to the ED with complaint of left knee pain and swelling, onset one week. Patient reports having gradual onset of left knee pain and swelling. Denies any recent fall, trauma or injury. Denies fever, redness, warmth, numbness, tingling, weakness. Patient reports she has been taking her home medication of hydrocodone for her chronic back pain for her left knee pain but denies any relief. Patient denies having similar symptoms in the past. Denies history of gout or arthritis.  Past Medical History  Diagnosis Date  . Hypertension   . Diabetes mellitus   . Gallstones   . H/O hiatal hernia   . Headache(784.0)     hx of migraines   . Anemia   . Migraine    Past Surgical History  Procedure Laterality Date  . Abdominal hysterectomy    . Appendectomy    . Tonsillectomy    . Cesarean section    . Cholecystectomy N/A 12/28/2013    Procedure: LAPAROSCOPIC CHOLECYSTECTOMY WITH INTRAOPERATIVE CHOLANGIOGRAM;  Surgeon: Romie LeveeAlicia Thomas, MD;  Location: [facility];  Service: General;  Laterality: N/A;  . Hernia repair     No family history on file. Social History  Substance Use Topics  . Smoking status: Never Smoker   . Smokeless tobacco: Never Used  . Alcohol Use: No   OB History    No data available     Review of Systems  Constitutional: Negative for fever.  Cardiovascular: Negative for leg swelling.  Musculoskeletal: Positive for joint swelling (left knee) and arthralgias (left knee).  Skin: Negative for wound.  Neurological: Negative for weakness and numbness.      Allergies  Ketorolac; Chlorhexidine;  Erythromycin; Toradol; and Tramadol  Home Medications   Prior to Admission medications   Medication Sig Start Date End Date Taking? Authorizing Provider  acyclovir (ZOVIRAX) 400 MG tablet Take 2 tablets (800 mg total) by mouth 5 (five) times daily. 05/27/16   Hannah Muthersbaugh, PA-C  amitriptyline (ELAVIL) 10 MG tablet Take 40 mg by mouth at bedtime.    Historical Provider, MD  amLODipine (NORVASC) 10 MG tablet Take 10 mg by mouth daily.    Historical Provider, MD  gabapentin (NEURONTIN) 300 MG capsule Take 300 mg by mouth 3 (three) times daily.    Historical Provider, MD  hydrocortisone 2.5 % lotion Apply topically 2 (two) times daily. 05/27/16   Hannah Muthersbaugh, PA-C  metFORMIN (GLUCOPHAGE) 1000 MG tablet Take 1,000 mg by mouth 2 (two) times daily with a meal.    Historical Provider, MD  methocarbamol (ROBAXIN) 500 MG tablet Take 500 mg by mouth 4 (four) times daily.    Historical Provider, MD  oxyCODONE-acetaminophen (PERCOCET/ROXICET) 5-325 MG tablet Take 1 tablet by mouth every 4 (four) hours as needed for severe pain. 06/23/16   Satira SarkNicole Elizabeth Tsering Leaman, PA-C   BP 131/110 mmHg  Pulse 92  Temp(Src) 98.7 F (37.1 C) (Oral)  Resp 20  Ht 5\' 5"  (1.651 m)  Wt 104.327 kg  BMI 38.27 kg/m2  SpO2 98% Physical Exam  Constitutional: She is oriented to person, place, and time. She appears well-developed and well-nourished.  HENT:  Head: Normocephalic and atraumatic.  Eyes: Conjunctivae and EOM are normal. Right eye exhibits no discharge. Left eye exhibits no discharge. No scleral icterus.  Neck: Normal range of motion. Neck supple.  Pulmonary/Chest: Effort normal.  Musculoskeletal: She exhibits tenderness. She exhibits no edema.       Left knee: She exhibits decreased range of motion (due to pain). She exhibits no swelling, no effusion, no ecchymosis, no deformity, no laceration, no erythema, normal alignment, no LCL laxity, normal patellar mobility and no MCL laxity. Tenderness found.  Patellar tendon tenderness noted. No medial joint line and no lateral joint line tenderness noted.  No MCL, LCL, anterior cruciate ligament or PCL laxity. Decreased range of motion of left knee due to reported pain. Range of motion of left hip ankle and foot. Sensation grossly intact. 2+ DP pulses. Cap refill less than 2.  Neurological: She is alert and oriented to person, place, and time.  Skin: Skin is warm and dry.  Nursing note and vitals reviewed.   ED Course  Procedures (including critical care time) Labs Review Labs Reviewed - No data to display  Imaging Review Dg Knee Complete 4 Views Left  06/23/2016  CLINICAL DATA:  Left knee pain for 1 week, no known injury, initial encounter EXAM: LEFT KNEE - COMPLETE 4+ VIEW COMPARISON:  None. FINDINGS: Four views of the left knee submitted. No acute fracture or subluxation. No radiopaque foreign body. No significant joint effusion. IMPRESSION: Negative. Electronically Signed   By: Natasha MeadLiviu  Pop M.D.   On: 06/23/2016 12:20   I have personally reviewed and evaluated these images and lab results as part of my medical decision-making.   EKG Interpretation None      MDM   Final diagnoses:  Left knee pain    Patient presents with left knee pain and swelling over the past week. Denies any recent fall or injury. VSS. Exam revealed mild TTP over left anterior knee with dec ROM due to reported pain. Left lower extremity neurovascular intact. Patient given pain meds in the ED. Left knee x-ray negative. On reevaluation patient reports her pain has improved. Discussed results and plan for discharge with patient. Plan to discharge patient home with pain meds and symptomatic treatment. Advised patient to follow up with orthopedist in the next week if her pain has not improved. Discussed return precautions with patient.    Satira Sarkicole Elizabeth OswegoNadeau, [place] JerseyPA-C 06/23/16 1327  Rolan BuccoMelanie Belfi, MD 06/23/16 1444

## 2016-08-01 ENCOUNTER — Encounter (HOSPITAL_BASED_OUTPATIENT_CLINIC_OR_DEPARTMENT_OTHER): Payer: Self-pay

## 2016-08-01 ENCOUNTER — Emergency Department (HOSPITAL_BASED_OUTPATIENT_CLINIC_OR_DEPARTMENT_OTHER)
Admission: EM | Admit: 2016-08-01 | Discharge: 2016-08-01 | Disposition: A | Discharge status: Home / Self Care | Payer: 59 | Attending: Physician Assistant | Admitting: Physician Assistant

## 2016-08-01 DIAGNOSIS — R112 Nausea with vomiting, unspecified: Secondary | ICD-10-CM

## 2016-08-01 DIAGNOSIS — E119 Type 2 diabetes mellitus without complications: Secondary | ICD-10-CM | POA: Diagnosis not present

## 2016-08-01 DIAGNOSIS — Z7984 Long term (current) use of oral hypoglycemic drugs: Secondary | ICD-10-CM | POA: Diagnosis not present

## 2016-08-01 DIAGNOSIS — I10 Essential (primary) hypertension: Secondary | ICD-10-CM | POA: Insufficient documentation

## 2016-08-01 DIAGNOSIS — Z79899 Other long term (current) drug therapy: Secondary | ICD-10-CM | POA: Insufficient documentation

## 2016-08-01 DIAGNOSIS — R1013 Epigastric pain: Secondary | ICD-10-CM | POA: Diagnosis present

## 2016-08-01 LAB — COMPREHENSIVE METABOLIC PANEL
ALK PHOS: 51 U/L (ref 38–126)
ALT: 21 U/L (ref 14–54)
ANION GAP: 10 (ref 5–15)
AST: 18 U/L (ref 15–41)
Albumin: 4.1 g/dL (ref 3.5–5.0)
BILIRUBIN TOTAL: 0.5 mg/dL (ref 0.3–1.2)
BUN: 20 mg/dL (ref 6–20)
CALCIUM: 9.1 mg/dL (ref 8.9–10.3)
CO2: 25 mmol/L (ref 22–32)
CREATININE: 0.8 mg/dL (ref 0.44–1.00)
Chloride: 100 mmol/L — ABNORMAL LOW (ref 101–111)
GFR calc non Af Amer: >60 mL/min (ref >60)
GLUCOSE: 207 mg/dL — AB (ref 65–99)
Potassium: 4 mmol/L (ref 3.5–5.1)
Sodium: 135 mmol/L (ref 135–145)
TOTAL PROTEIN: 7.4 g/dL (ref 6.5–8.1)

## 2016-08-01 LAB — LIPASE, BLOOD: Lipase: 23 U/L (ref 11–51)

## 2016-08-01 LAB — URINALYSIS, ROUTINE W REFLEX MICROSCOPIC
Glucose, UA: 100 mg/dL — AB
Hgb urine dipstick: NEGATIVE
Ketones, ur: 15 mg/dL — AB
Leukocytes, UA: NEGATIVE
Nitrite: NEGATIVE
Protein, ur: NEGATIVE mg/dL
Specific Gravity, Urine: 1.038 — ABNORMAL HIGH (ref 1.005–1.030)
pH: 5.5 (ref 5.0–8.0)

## 2016-08-01 LAB — CBC
HCT: 38.9 % (ref 36.0–46.0)
HEMOGLOBIN: 13.1 g/dL (ref 12.0–15.0)
MCH: 28.7 pg (ref 26.0–34.0)
MCHC: 33.7 g/dL (ref 30.0–36.0)
MCV: 85.3 fL (ref 78.0–100.0)
Platelets: 309 10*3/uL (ref 150–400)
RBC: 4.56 MIL/uL (ref 3.87–5.11)
RDW: 13.4 % (ref 11.5–15.5)
WBC: 5.9 10*3/uL (ref 4.0–10.5)

## 2016-08-01 LAB — PREGNANCY, URINE: PREG TEST UR: NEGATIVE

## 2016-08-01 MED ORDER — SODIUM CHLORIDE 0.9 % IV BOLUS (SEPSIS)
1000.0000 mL | Freq: Once | INTRAVENOUS | Status: AC
  Administered 2016-08-01: 1000 mL via INTRAVENOUS

## 2016-08-01 MED ORDER — FAMOTIDINE IN NACL 20-0.9 MG/50ML-% IV SOLN
20.0000 mg | Freq: Once | INTRAVENOUS | Status: AC
  Administered 2016-08-01: 20 mg via INTRAVENOUS
  Filled 2016-08-01: qty 50

## 2016-08-01 MED ORDER — ONDANSETRON HCL 4 MG/2ML IJ SOLN
4.0000 mg | Freq: Once | INTRAMUSCULAR | Status: AC
  Administered 2016-08-01: 4 mg via INTRAVENOUS
  Filled 2016-08-01: qty 2

## 2016-08-01 MED ORDER — ONDANSETRON 4 MG PO TBDP: 4.0000 mg | ORAL_TABLET | Freq: Three times a day (TID) | ORAL | 0 refills | Status: DC | PRN

## 2016-08-01 MED ORDER — GI COCKTAIL ~~LOC~~
30.0000 mL | Freq: Once | ORAL | Status: AC
  Administered 2016-08-01: 30 mL via ORAL
  Filled 2016-08-01: qty 30

## 2016-08-01 NOTE — ED Provider Notes (Signed)
[facility]   CSN: [id number] Arrival date & time: 08/01/16  1238  First Provider Contact:  None       History   Chief Complaint Chief Complaint  Patient presents with  . Abdominal Pain    HPI Leslie Gentry is a 45 y.o. female.    Patient presents today with a chief complaint of abdominal pain, nausea, vomiting, and diarrhea.  She reports onset of symptoms two days ago.  She reports 6-7 episodes of vomiting and loose stool, but no hematemesis or hematochezia.  She reports associated epigastric abdominal pain, which is worse with vomiting.  She has not taken anything for symptoms prior to arrival.  She denies fever, chills, urinary symptoms, chest pain, or any other symptoms at this time.  PMH significant for Cholecystectomy.   Past Medical History:  Diagnosis Date  . Anemia   . Diabetes mellitus   . Gallstones   . H/O hiatal hernia   . Headache(784.0)    hx of migraines   . Hypertension   . Migraine     Patient Active Problem List   Diagnosis Date Noted  . Low back pain 11/16/2015  . Hernia, incisional 05/25/2015  . Type 2 diabetes mellitus (HCC) 11/11/2014  . Acid reflux 06/05/2013  . Essential (primary) hypertension 06/05/2013  . Class 2 obesity 05/27/2013  . Hypercholesterolemia 05/27/2013  . Cyst (solitary) of breast 11/24/2011  . H/O total hysterectomy 11/24/2011  . Acquired cyst of kidney 11/24/2011  . Clinical depression 11/24/2011    Past Surgical History:  Procedure Laterality Date  . ABDOMINAL HYSTERECTOMY    . APPENDECTOMY    . CESAREAN SECTION    . CHOLECYSTECTOMY N/A 12/28/2013   Procedure: LAPAROSCOPIC CHOLECYSTECTOMY WITH INTRAOPERATIVE CHOLANGIOGRAM;  Surgeon: Romie Levee, MD;  Location: [facility];  Service: General;  Laterality: N/A;  . HERNIA REPAIR    . TONSILLECTOMY      OB History    No data available       Home Medications    Prior to Admission medications   Medication Sig Start Date End Date  Taking? Authorizing Provider  glipiZIDE (GLUCOTROL) 5 MG tablet Take by mouth daily before breakfast.   Yes Historical Provider, MD  amitriptyline (ELAVIL) 10 MG tablet Take 40 mg by mouth at bedtime.    Historical Provider, MD  amLODipine (NORVASC) 10 MG tablet Take 10 mg by mouth daily.    Historical Provider, MD  gabapentin (NEURONTIN) 300 MG capsule Take 300 mg by mouth 3 (three) times daily.    Historical Provider, MD  metFORMIN (GLUCOPHAGE) 1000 MG tablet Take 1,000 mg by mouth 2 (two) times daily with a meal.    Historical Provider, MD  methocarbamol (ROBAXIN) 500 MG tablet Take 500 mg by mouth 4 (four) times daily.    Historical Provider, MD    Family History No family history on file.  Social History Social History  Substance Use Topics  . Smoking status: Never Smoker  . Smokeless tobacco: Never Used  . Alcohol use No     Allergies   Ketorolac; Chlorhexidine; Erythromycin; Toradol [ketorolac tromethamine]; and Tramadol   Review of Systems Review of Systems  All other systems reviewed and are negative.    Physical Exam Updated Vital Signs BP (!) 132/104 (BP Location: Left Arm)   Pulse 114   Temp 97.9 F (36.6 C) (Oral)   Resp 20   Ht  (1.651 m)   Wt 104.3 kg   SpO2  99%   BMI 38.27 kg/m   Physical Exam  Constitutional: She appears well-developed and well-nourished.  HENT:  Head: Normocephalic and atraumatic.  Neck: Normal range of motion. Neck supple.  Cardiovascular: Normal rate and regular rhythm.   Pulmonary/Chest: Effort normal and breath sounds normal.  Abdominal: Soft. Bowel sounds are normal. She exhibits no distension and no mass. There is tenderness in the epigastric area. There is no rebound and no guarding. No hernia.  Musculoskeletal: Normal range of motion.  Neurological: She is alert.  Skin: Skin is warm.  Psychiatric: She has a normal mood and affect.  Nursing note and vitals reviewed.    ED Treatments / Results  Labs (all labs  ordered are listed, but only abnormal results are displayed) Labs Reviewed  URINALYSIS, ROUTINE W REFLEX MICROSCOPIC (NOT AT [facility]) - Abnormal; Notable for the following:       Result Value   Color, Urine AMBER (*)    APPearance CLOUDY (*)    Specific Gravity, Urine 1.038 (*)    Glucose, UA 100 (*)    Bilirubin Urine SMALL (*)    Ketones, ur 15 (*)    All other components within normal limits  CBC  LIPASE, BLOOD  COMPREHENSIVE METABOLIC PANEL    EKG  EKG Interpretation None       Radiology No results found.  Procedures Procedures (including critical care time)  Medications Ordered in ED Medications  sodium chloride 0.9 % bolus 1,000 mL (1,000 mLs Intravenous New Bag/Given 08/01/16 1309)  ondansetron (ZOFRAN) injection 4 mg (4 mg Intravenous Given 08/01/16 1312)     Initial Impression / Assessment and Plan / ED Course  I have reviewed the triage vital signs and the nursing notes.  Pertinent labs & imaging results that were available during my care of the patient were reviewed by me and considered in my medical decision making (see chart for details).  Clinical Course     Patient presents today with nausea, vomiting, diarrhea, and epigastric abdominal pain onset 2 days ago.  On exam, she has some tenderness to palpation of the epigastrium, but no rebound or guarding.  Labs unremarkable aside from glucose of 207.  Anion gap of 10.  Nausea improved in the ED after Zofran IV.  Suspect Viral Gastroenteritis.  Patient able to tolerate PO liquids.  Stable for discharge.  Return precautions given.     Final Clinical Impressions(s) / ED Diagnoses   Final diagnoses:  None    New Prescriptions New Prescriptions   No medications on file     Santiago Glad, PA-C 08/02/16 2135    Courteney Lyn Mackuen, MD 08/03/16 [phone]

## 2016-08-01 NOTE — ED Triage Notes (Signed)
C/o abd pain n/v x 2 days-NAD-steady gait

## 2017-01-08 ENCOUNTER — Encounter: Payer: Self-pay | Admitting: *Deleted

## 2017-01-08 ENCOUNTER — Emergency Department
Admission: EM | Admit: 2017-01-08 | Discharge: 2017-01-08 | Disposition: A | Discharge status: Home / Self Care | Payer: 59 | Source: Home / Self Care | Attending: Family Medicine | Admitting: Family Medicine

## 2017-01-08 DIAGNOSIS — M545 Low back pain: Secondary | ICD-10-CM | POA: Diagnosis not present

## 2017-01-08 DIAGNOSIS — G8929 Other chronic pain: Secondary | ICD-10-CM | POA: Diagnosis not present

## 2017-01-08 MED ORDER — NAPROXEN 500 MG PO TABS: 500.0000 mg | ORAL_TABLET | Freq: Two times a day (BID) | ORAL | 1 refills | Status: DC

## 2017-01-08 MED ORDER — METAXALONE 800 MG PO TABS: 800.0000 mg | ORAL_TABLET | Freq: Three times a day (TID) | ORAL | 1 refills | Status: DC

## 2017-01-08 NOTE — ED Provider Notes (Signed)
[facility]    CSN: [id number] Arrival date & time: 01/08/17  1118     History   Chief Complaint Chief Complaint  Patient presents with  . Back Pain    HPI Leslie Gentry is a 46 y.o. female.   Patient has a history of chronic back pain that became worse four days ago.  Her increased pain is on her right lower back, worse when sitting/standing.  She recalls no injury.   She denies bowel or bladder dysfunction, and no saddle numbness.   Her pain has not responded to hydrocodone, Robaxin, and Zanaflex.  She has also tried Voltaren gel. She has a follow-up appointment with Dr. Jennet Maduro for her back pain.   The history is provided by the patient.  Back Pain  Location:  Lumbar spine Quality:  Aching Radiates to:  Does not radiate Pain severity:  Moderate Pain is:  Same all the time Onset quality:  Gradual Duration:  4 days Timing:  Constant Progression:  Worsening Chronicity:  Chronic Context: not falling and not recent injury   Relieved by:  Nothing Worsened by:  Ambulation Ineffective treatments:  Muscle relaxants and narcotics Associated symptoms: no abdominal pain, no bladder incontinence, no bowel incontinence, no chest pain, no dysuria, no fever, no leg pain, no numbness, no paresthesias, no perianal numbness, no tingling and no weakness   Risk factors: obesity     Past Medical History:  Diagnosis Date  . Anemia   . Diabetes mellitus   . Gallstones   . H/O hiatal hernia   . Headache(784.0)    hx of migraines   . Hypertension   . Migraine     Patient Active Problem List   Diagnosis Date Noted  . Low back pain 11/16/2015  . Hernia, incisional 05/25/2015  . Type 2 diabetes mellitus (HCC) 11/11/2014  . Acid reflux 06/05/2013  . Essential (primary) hypertension 06/05/2013  . Class 2 obesity 05/27/2013  . Hypercholesterolemia 05/27/2013  . Cyst (solitary) of breast 11/24/2011  . H/O total hysterectomy 11/24/2011  . Acquired cyst of  kidney 11/24/2011  . Clinical depression 11/24/2011    Past Surgical History:  Procedure Laterality Date  . ABDOMINAL HYSTERECTOMY    . APPENDECTOMY    . CESAREAN SECTION    . CHOLECYSTECTOMY N/A 12/28/2013   Procedure: LAPAROSCOPIC CHOLECYSTECTOMY WITH INTRAOPERATIVE CHOLANGIOGRAM;  Surgeon: Romie Levee, MD;  Location: [facility];  Service: General;  Laterality: N/A;  . HERNIA REPAIR    . TONSILLECTOMY      OB History    No data available       Home Medications    Prior to Admission medications   Medication Sig Start Date End Date Taking? Authorizing Provider  amLODipine (NORVASC) 10 MG tablet Take 10 mg by mouth daily.   Yes Historical Provider, MD  gabapentin (NEURONTIN) 300 MG capsule Take 300 mg by mouth 3 (three) times daily.   Yes Historical Provider, MD  glipiZIDE (GLUCOTROL) 5 MG tablet Take by mouth daily before breakfast.   Yes Historical Provider, MD  metFORMIN (GLUCOPHAGE) 1000 MG tablet Take 1,000 mg by mouth 2 (two) times daily with a meal.   Yes Historical Provider, MD  methocarbamol (ROBAXIN) 500 MG tablet Take 500 mg by mouth 4 (four) times daily.   Yes Historical Provider, MD  amitriptyline (ELAVIL) 10 MG tablet Take 40 mg by mouth at bedtime.    Historical Provider, MD  metaxalone (SKELAXIN) 800 MG tablet Take 1 tablet (800 mg  total) by mouth 3 (three) times daily. 01/08/17   Lattie HawStephen A Nisa Decaire, MD  naproxen (NAPROSYN) 500 MG tablet Take 1 tablet (500 mg total) by mouth 2 (two) times daily. (every 12 hours with food) 01/08/17   Lattie HawStephen A Amiere Cawley, MD  ondansetron (ZOFRAN ODT) 4 MG disintegrating tablet Take 1 tablet (4 mg total) by mouth every 8 (eight) hours as needed for nausea or vomiting. 08/01/16   Santiago GladHeather Laisure, PA-C    Family History History reviewed. No pertinent family history.  Social History Social History  Substance Use Topics  . Smoking status: Never Smoker  . Smokeless tobacco: Never Used  . Alcohol use No     Allergies   Ketorolac;  Chlorhexidine; Demerol [meperidine]; Erythromycin; Toradol [ketorolac tromethamine]; and Tramadol   Review of Systems Review of Systems  Constitutional: Negative for fever.  Cardiovascular: Negative for chest pain.  Gastrointestinal: Negative for abdominal pain and bowel incontinence.  Genitourinary: Negative for bladder incontinence and dysuria.  Musculoskeletal: Positive for back pain.  Neurological: Negative for tingling, weakness, numbness and paresthesias.  All other systems reviewed and are negative.    Physical Exam Triage Vital Signs ED Triage Vitals  Enc Vitals Group     BP 01/08/17 1152 (!) 148/102     Pulse Rate 01/08/17 1152 108     Resp 01/08/17 1152 16     Temp 01/08/17 1152 97.7 F (36.5 C)     Temp Source 01/08/17 1152 Oral     SpO2 01/08/17 1152 99 %     Weight 01/08/17 1153 237 lb (107.5 kg)     Height 01/08/17 1153 5\' 5"  (1.651 m)     Head Circumference --      Peak Flow --      Pain Score 01/08/17 1155 10     Pain Loc --      Pain Edu? --      Excl. in GC? --    No data found.   Updated Vital Signs BP (!) 148/102 (BP Location: Left Arm)   Pulse 108   Temp 97.7 F (36.5 C) (Oral)   Resp 16   Ht 5\' 5"  (1.651 m)   Wt 237 lb (107.5 kg)   SpO2 99%   BMI 39.44 kg/m   Visual Acuity Right Eye Distance:   Left Eye Distance:   Bilateral Distance:    Right Eye Near:   Left Eye Near:    Bilateral Near:     Physical Exam  Constitutional: She appears well-developed and well-nourished. No distress.  HENT:  Head: Normocephalic.  Right Ear: External ear normal.  Left Ear: External ear normal.  Nose: Nose normal.  Mouth/Throat: Oropharynx is clear and moist.  Eyes: Conjunctivae are normal. Pupils are equal, round, and reactive to light.  Neck: Normal range of motion.  Cardiovascular: Normal heart sounds.   Pulmonary/Chest: Breath sounds normal.  Abdominal: There is no tenderness.  Musculoskeletal: She exhibits no edema.       Back:  Back:   Range of motion relatively well preserved.  Can heel/toe walk and squat without difficulty.    Tenderness in the midline and right paraspinous muscles from L1 to Sacral area.  Straight leg raising test is negative.  Sitting knee extension test is negative.  Strength and sensation in the lower extremities is normal.  Patellar reflexes are normal with augmentation.  Achilles reflexes normal.  Neurological: She is alert.  Skin: Skin is warm and dry. No rash noted.  Nursing note and  vitals reviewed.    UC Treatments / Results  Labs (all labs ordered are listed, but only abnormal results are displayed) Labs Reviewed - No data to display  EKG  EKG Interpretation None       Radiology No results found.  Procedures Procedures (including critical care time)  Medications Ordered in UC Medications - No data to display   Initial Impression / Assessment and Plan / UC Course  I have reviewed the triage vital signs and the nursing notes.  Pertinent labs & imaging results that were available during my care of the patient were reviewed by me and considered in my medical decision making (see chart for details).  Clinical Course   Begin trial of Naproxen 500mg  BID, and Skelaxin 800mg  TID. Apply ice pack for 20 to 30 minutes, 3 to 4 times daily  Continue until pain and swelling decrease.  Followup with Dr. Jennet Maduro as scheduled.     Final Clinical Impressions(s) / UC Diagnoses   Final diagnoses:  Chronic bilateral low back pain, with sciatica presence unspecified    New Prescriptions New Prescriptions   METAXALONE (SKELAXIN) 800 MG TABLET    Take 1 tablet (800 mg total) by mouth 3 (three) times daily.   NAPROXEN (NAPROSYN) 500 MG TABLET    Take 1 tablet (500 mg total) by mouth 2 (two) times daily. (every 12 hours with food)     Lattie Haw, MD 01/22/17 0600

## 2017-01-08 NOTE — ED Triage Notes (Signed)
Pt reports chronic back pain with worsening back pain x 4 days ago. Denies injury. Her pain is RT side lower back. She has taken Hydrocondone, robaxin, and another muscle relaxer that begins with a "T" she is unsure of the name.

## 2017-01-08 NOTE — Discharge Instructions (Signed)
Apply ice pack for 20 to 30 minutes, 3 to 4 times daily  Continue until pain and swelling decrease.  °

## 2017-04-20 ENCOUNTER — Emergency Department (HOSPITAL_BASED_OUTPATIENT_CLINIC_OR_DEPARTMENT_OTHER)
Admission: EM | Admit: 2017-04-20 | Discharge: 2017-04-20 | Disposition: A | Discharge status: Home / Self Care | Payer: 59 | Attending: Emergency Medicine | Admitting: Emergency Medicine

## 2017-04-20 ENCOUNTER — Encounter (HOSPITAL_BASED_OUTPATIENT_CLINIC_OR_DEPARTMENT_OTHER): Payer: Self-pay | Admitting: *Deleted

## 2017-04-20 DIAGNOSIS — Z7984 Long term (current) use of oral hypoglycemic drugs: Secondary | ICD-10-CM | POA: Diagnosis not present

## 2017-04-20 DIAGNOSIS — Z79899 Other long term (current) drug therapy: Secondary | ICD-10-CM | POA: Diagnosis not present

## 2017-04-20 DIAGNOSIS — E119 Type 2 diabetes mellitus without complications: Secondary | ICD-10-CM | POA: Diagnosis not present

## 2017-04-20 DIAGNOSIS — G43909 Migraine, unspecified, not intractable, without status migrainosus: Secondary | ICD-10-CM | POA: Insufficient documentation

## 2017-04-20 DIAGNOSIS — R51 Headache: Secondary | ICD-10-CM | POA: Diagnosis present

## 2017-04-20 DIAGNOSIS — I10 Essential (primary) hypertension: Secondary | ICD-10-CM | POA: Diagnosis not present

## 2017-04-20 HISTORY — DX: Other chronic pain: G89.29

## 2017-04-20 HISTORY — DX: Dorsalgia, unspecified: M54.9

## 2017-04-20 MED ORDER — PROCHLORPERAZINE EDISYLATE 5 MG/ML IJ SOLN
5.0000 mg | Freq: Once | INTRAMUSCULAR | Status: AC
  Administered 2017-04-20: 5 mg via INTRAVENOUS
  Filled 2017-04-20: qty 2

## 2017-04-20 MED ORDER — DIPHENHYDRAMINE HCL 50 MG/ML IJ SOLN
25.0000 mg | Freq: Once | INTRAMUSCULAR | Status: AC
  Administered 2017-04-20: 25 mg via INTRAVENOUS
  Filled 2017-04-20: qty 1

## 2017-04-20 MED ORDER — SODIUM CHLORIDE 0.9 % IV BOLUS (SEPSIS)
1000.0000 mL | Freq: Once | INTRAVENOUS | Status: AC
  Administered 2017-04-20: 1000 mL via INTRAVENOUS

## 2017-04-20 NOTE — ED Triage Notes (Signed)
Pt reports migraine since last night with associated light and sound sensitivity, lightheadedness. Denies fever, n/v/d, head injury, numbness/tingling, weakness. Pt denies meds PTA today. Reports taking Elavil last night that was ineffective.

## 2017-04-20 NOTE — ED Notes (Signed)
ED Provider at bedside. 

## 2017-04-20 NOTE — ED Provider Notes (Addendum)
[facility] Provider Note   CSN: [id number] Arrival date & time: 04/20/17  4098     History   Chief Complaint Chief Complaint  Patient presents with  . Migraine    HPI Leslie Gentry is a 46 y.o. female with history of migraines who presents with a headache that began last night. Patient describes her headache is over her right eye and radiating back to the right-side of her neck. She has had sensitivity to light. Her headache is also worse with laying flat. She states this is typical of her normal migraine, but the intensity is much worse. Patient has had associated right sided blurred vision. She took her at home amitriptyline without any relief. Patient has not taken any other medications at home. Patient denies any numbness or tingling, fevers, chest pain, shortness of breath, abdominal pain, nausea, vomiting, urinary symptoms.  HPI  Past Medical History:  Diagnosis Date  . Anemia   . Chronic back pain   . Diabetes mellitus   . Gallstones   . H/O hiatal hernia   . Headache(784.0)    hx of migraines   . Hypertension   . Migraine     Patient Active Problem List   Diagnosis Date Noted  . Low back pain 11/16/2015  . Hernia, incisional 05/25/2015  . Type 2 diabetes mellitus (HCC) 11/11/2014  . Acid reflux 06/05/2013  . Essential (primary) hypertension 06/05/2013  . Class 2 obesity 05/27/2013  . Hypercholesterolemia 05/27/2013  . Cyst (solitary) of breast 11/24/2011  . H/O total hysterectomy 11/24/2011  . Acquired cyst of kidney 11/24/2011  . Clinical depression 11/24/2011    Past Surgical History:  Procedure Laterality Date  . ABDOMINAL HYSTERECTOMY    . APPENDECTOMY    . CESAREAN SECTION    . CHOLECYSTECTOMY N/A 12/28/2013   Procedure: LAPAROSCOPIC CHOLECYSTECTOMY WITH INTRAOPERATIVE CHOLANGIOGRAM;  Surgeon: Romie Levee, MD;  Location: [facility];  Service: General;  Laterality: N/A;  . HERNIA REPAIR    . TONSILLECTOMY      OB History    No  data available       Home Medications    Prior to Admission medications   Medication Sig Start Date End Date Taking? Authorizing Provider  amitriptyline (ELAVIL) 10 MG tablet Take 40 mg by mouth at bedtime.   Yes Historical Provider, MD  amLODipine (NORVASC) 10 MG tablet Take 10 mg by mouth daily.   Yes Historical Provider, MD  gabapentin (NEURONTIN) 300 MG capsule Take 300 mg by mouth 3 (three) times daily.   Yes Historical Provider, MD  glipiZIDE (GLUCOTROL) 5 MG tablet Take by mouth daily before breakfast.   Yes Historical Provider, MD  HYDROcodone-acetaminophen (NORCO/VICODIN) 5-325 MG tablet Take 1 tablet by mouth every 6 (six) hours as needed for moderate pain.   Yes Historical Provider, MD  losartan (COZAAR) 25 MG tablet Take 25 mg by mouth daily.   Yes Historical Provider, MD  metFORMIN (GLUCOPHAGE) 1000 MG tablet Take 1,000 mg by mouth 2 (two) times daily with a meal.   Yes Historical Provider, MD  metaxalone (SKELAXIN) 800 MG tablet Take 1 tablet (800 mg total) by mouth 3 (three) times daily. 01/08/17   Lattie Haw, MD  methocarbamol (ROBAXIN) 500 MG tablet Take 500 mg by mouth 4 (four) times daily.    Historical Provider, MD  naproxen (NAPROSYN) 500 MG tablet Take 1 tablet (500 mg total) by mouth 2 (two) times daily. (every 12 hours with food) 01/08/17   Jeannett Senior  A Beese, MD  ondansetron (ZOFRAN ODT) 4 MG disintegrating tablet Take 1 tablet (4 mg total) by mouth every 8 (eight) hours as needed for nausea or vomiting. 08/01/16   Santiago Glad, PA-C    Family History No family history on file.  Social History Social History  Substance Use Topics  . Smoking status: Never Smoker  . Smokeless tobacco: Never Used  . Alcohol use No     Allergies   Ketorolac; Chlorhexidine; Demerol [meperidine]; Erythromycin; Toradol [ketorolac tromethamine]; and Tramadol   Review of Systems Review of Systems  Constitutional: Negative for chills and fever.  HENT: Negative for facial  swelling and sore throat.   Eyes: Positive for photophobia and visual disturbance.  Respiratory: Negative for shortness of breath.   Cardiovascular: Negative for chest pain.  Gastrointestinal: Negative for abdominal pain, nausea and vomiting.  Genitourinary: Negative for dysuria.  Musculoskeletal: Positive for neck pain (R sided). Negative for back pain and neck stiffness.  Skin: Negative for rash and wound.  Neurological: Positive for headaches.  Psychiatric/Behavioral: The patient is not nervous/anxious.      Physical Exam Updated Vital Signs BP (!) 178/116 (BP Location: Left Arm)   Pulse 78   Temp 98 F (36.7 C) (Oral)   Resp 18   Ht  (1.651 m)   Wt 104.3 kg   SpO2 100%   BMI 38.27 kg/m   Physical Exam  Constitutional: She appears well-developed and well-nourished. No distress.  HENT:  Head: Normocephalic and atraumatic.    Mouth/Throat: Oropharynx is clear and moist. No oropharyngeal exudate.  Eyes: Conjunctivae and EOM are normal. Pupils are equal, round, and reactive to light. Right eye exhibits no discharge. Left eye exhibits no discharge. No scleral icterus.  Photophobia in R eye; pain with EOMs in R eye; no erythema or edema  Neck: Normal range of motion. Neck supple. No thyromegaly present.    No meningismus  Cardiovascular: Normal rate, regular rhythm, normal heart sounds and intact distal pulses.  Exam reveals no gallop and no friction rub.   No murmur heard. Pulmonary/Chest: Effort normal and breath sounds normal. No stridor. No respiratory distress. She has no wheezes. She has no rales.  Abdominal: Soft. Bowel sounds are normal. She exhibits no distension. There is no tenderness. There is no rebound and no guarding.  Musculoskeletal: She exhibits no edema.  Lymphadenopathy:    She has no cervical adenopathy.  Neurological: She is alert. Coordination normal.  CN 3-12 intact; normal sensation throughout; 5/5 strength in all 4 extremities; equal  bilateral grip strength  Skin: Skin is warm and dry. No rash noted. She is not diaphoretic. No pallor.  Psychiatric: She has a normal mood and affect.  Nursing note and vitals reviewed.    ED Treatments / Results  Labs (all labs ordered are listed, but only abnormal results are displayed) Labs Reviewed - No data to display  EKG  EKG Interpretation None       Radiology No results found.  Procedures Procedures (including critical care time)  Medications Ordered in ED Medications  sodium chloride 0.9 % bolus 1,000 mL (1,000 mLs Intravenous New Bag/Given 04/20/17 0945)  prochlorperazine (COMPAZINE) injection 5 mg (5 mg Intravenous Given 04/20/17 0950)  diphenhydrAMINE (BENADRYL) injection 25 mg (25 mg Intravenous Given 04/20/17 0949)     Initial Impression / Assessment and Plan / ED Course  I have reviewed the triage vital signs and the nursing notes.  Pertinent labs & imaging results that were available during  my care of the patient were reviewed by me and considered in my medical decision making (see chart for details).     Pt HA treatedWith 1 L fluid bolus, Compazine, and Benadryl and improved while in ED.  Presentation is like pts typical HA and non concerning for [facility], ICH, Meningitis, or temporal arteritis. Pt is afebrile with no focal neuro deficits or nuchal rigidity. Vision change resolved with treatment of headache. Pt is to follow up with neurologist to discuss today's breakthrough migraine. Return precautions discussed. Pt verbalizes understanding and is agreeable with plan to dc. Patient discharged in satisfactory condition.   Final Clinical Impressions(s) / ED Diagnoses   Final diagnoses:  Migraine without status migrainosus, not intractable, unspecified migraine type    New Prescriptions New Prescriptions   No medications on file     Emi Holes, Cordelia Poche 04/20/17 1053    Geoffery Lyons, MD 04/20/17 [id number] [place], PA-C 04/20/17 1143      Geoffery Lyons, MD 04/20/17 1334

## 2017-04-20 NOTE — Discharge Instructions (Signed)
Please follow-up with your neurologist to make them aware of today's migraine. They may want to evaluate your medications. Please return to emergency department if you develop any new or worsening symptoms.

## 2017-05-15 DIAGNOSIS — Z955 Presence of coronary angioplasty implant and graft: Secondary | ICD-10-CM

## 2017-05-15 HISTORY — DX: Presence of coronary angioplasty implant and graft: Z95.5

## 2017-07-26 ENCOUNTER — Emergency Department (HOSPITAL_BASED_OUTPATIENT_CLINIC_OR_DEPARTMENT_OTHER)
Admission: EM | Admit: 2017-07-26 | Discharge: 2017-07-26 | Disposition: A | Discharge status: Home / Self Care | Payer: 59 | Attending: Emergency Medicine | Admitting: Emergency Medicine

## 2017-07-26 ENCOUNTER — Encounter (HOSPITAL_BASED_OUTPATIENT_CLINIC_OR_DEPARTMENT_OTHER): Payer: Self-pay

## 2017-07-26 ENCOUNTER — Emergency Department (HOSPITAL_BASED_OUTPATIENT_CLINIC_OR_DEPARTMENT_OTHER): Payer: 59

## 2017-07-26 DIAGNOSIS — Y999 Unspecified external cause status: Secondary | ICD-10-CM | POA: Diagnosis not present

## 2017-07-26 DIAGNOSIS — S90425A Blister (nonthermal), left lesser toe(s), initial encounter: Secondary | ICD-10-CM | POA: Diagnosis present

## 2017-07-26 DIAGNOSIS — I10 Essential (primary) hypertension: Secondary | ICD-10-CM | POA: Insufficient documentation

## 2017-07-26 DIAGNOSIS — Z79899 Other long term (current) drug therapy: Secondary | ICD-10-CM | POA: Insufficient documentation

## 2017-07-26 DIAGNOSIS — Y939 Activity, unspecified: Secondary | ICD-10-CM | POA: Diagnosis not present

## 2017-07-26 DIAGNOSIS — X58XXXA Exposure to other specified factors, initial encounter: Secondary | ICD-10-CM | POA: Diagnosis not present

## 2017-07-26 DIAGNOSIS — Y929 Unspecified place or not applicable: Secondary | ICD-10-CM | POA: Diagnosis not present

## 2017-07-26 DIAGNOSIS — E119 Type 2 diabetes mellitus without complications: Secondary | ICD-10-CM | POA: Diagnosis not present

## 2017-07-26 DIAGNOSIS — Z7984 Long term (current) use of oral hypoglycemic drugs: Secondary | ICD-10-CM | POA: Diagnosis not present

## 2017-07-26 HISTORY — DX: Presence of coronary angioplasty implant and graft: Z95.5

## 2017-07-26 MED ORDER — BACITRACIN ZINC 500 UNIT/GM EX OINT
TOPICAL_OINTMENT | Freq: Two times a day (BID) | CUTANEOUS | Status: DC
  Administered 2017-07-26: 1 via TOPICAL

## 2017-07-26 MED ORDER — CEPHALEXIN 500 MG PO CAPS: 500.0000 mg | ORAL_CAPSULE | Freq: Four times a day (QID) | ORAL | 0 refills | Status: DC

## 2017-07-26 NOTE — ED Provider Notes (Signed)
[facility]   CSN: [id number] Arrival date & time: 07/26/17  1114     History   Chief Complaint Chief Complaint  Patient presents with  . Toe Pain    HPI Leslie Gentry is a 46 y.o. female.  HPI 46 year old African-American female past medical history significant for chronic back pain, diabetes, hypertension, CAD with recent PCI placement that presents to the ED today with complaints of blister to her left fourth toe. The patient states that she noticed a blister 2 days ago. It popped yesterday. Has been draining fluid that she describes as yellow with a small amount of blood. States that it is painful to palpation. Has been taking Tylenol and her chronic hydrocodone with little relief. Pressure and movement makes the pain worse. Nothing makes the pain better. She denies any recent pedicures. States that the area is pruritic. Patient is concerned because she has a history of diabetes. Denies any associated symptoms of fever, chills, nausea, vomiting, redness, warmth. States that she does have history of recurrent single infections on her left foot but this is different. It is followed by podiatry yearly. Tdap is utd. Past Medical History:  Diagnosis Date  . Anemia   . Chronic back pain   . Diabetes mellitus   . Gallstones   . H/O heart artery stent 05/15/2017  . H/O hiatal hernia   . Headache(784.0)    hx of migraines   . Hypertension   . Migraine     Patient Active Problem List   Diagnosis Date Noted  . Low back pain 11/16/2015  . Hernia, incisional 05/25/2015  . Type 2 diabetes mellitus (HCC) 11/11/2014  . Acid reflux 06/05/2013  . Essential (primary) hypertension 06/05/2013  . Class 2 obesity 05/27/2013  . Hypercholesterolemia 05/27/2013  . Cyst (solitary) of breast 11/24/2011  . H/O total hysterectomy 11/24/2011  . Acquired cyst of kidney 11/24/2011  . Clinical depression 11/24/2011    Past Surgical History:  Procedure Laterality  Date  . ABDOMINAL HYSTERECTOMY    . APPENDECTOMY    . CESAREAN SECTION    . CHOLECYSTECTOMY N/A 12/28/2013   Procedure: LAPAROSCOPIC CHOLECYSTECTOMY WITH INTRAOPERATIVE CHOLANGIOGRAM;  Surgeon: Romie Levee, MD;  Location: [facility];  Service: General;  Laterality: N/A;  . HERNIA REPAIR    . RIGHT AND LEFT HEART CATH    . TONSILLECTOMY      OB History    No data available       Home Medications    Prior to Admission medications   Medication Sig Start Date End Date Taking? Authorizing Provider  amitriptyline (ELAVIL) 10 MG tablet Take 40 mg by mouth at bedtime.   Yes [provider]  amLODipine (NORVASC) 10 MG tablet Take 10 mg by mouth daily.   Yes [provider]  gabapentin (NEURONTIN) 300 MG capsule Take 300 mg by mouth 3 (three) times daily.   Yes [provider]  glipiZIDE (GLUCOTROL) 5 MG tablet Take by mouth daily before breakfast.   Yes [provider]  HYDROcodone-acetaminophen (NORCO/VICODIN) 5-325 MG tablet Take 1 tablet by mouth every 6 (six) hours as needed for moderate pain.   Yes [provider]  losartan (COZAAR) 25 MG tablet Take 25 mg by mouth daily.   Yes [provider]  metFORMIN (GLUCOPHAGE) 1000 MG tablet Take 1,000 mg by mouth 2 (two) times daily with a meal.   Yes [provider]  ticagrelor (BRILINTA) 90 MG TABS tablet Take by mouth  2 (two) times daily.   Yes [provider]  TiZANidine HCl (ZANAFLEX PO) Take by mouth.   Yes [provider]  metaxalone (SKELAXIN) 800 MG tablet Take 1 tablet (800 mg total) by mouth 3 (three) times daily. 01/08/17   Lattie HawBeese, Stephen A, MD  methocarbamol (ROBAXIN) 500 MG tablet Take 500 mg by mouth 4 (four) times daily.    [provider]  naproxen (NAPROSYN) 500 MG tablet Take 1 tablet (500 mg total) by mouth 2 (two) times daily. (every 12 hours with food) 01/08/17   Lattie HawBeese, Stephen A, MD  ondansetron (ZOFRAN ODT) 4 MG disintegrating tablet Take  1 tablet (4 mg total) by mouth every 8 (eight) hours as needed for nausea or vomiting. 08/01/16   Santiago GladLaisure, Heather, PA-C    Family History History reviewed. No pertinent family history.  Social History Social History  Substance Use Topics  . Smoking status: Never Smoker  . Smokeless tobacco: Never Used  . Alcohol use No     Allergies   Ketorolac; Chlorhexidine; Demerol [meperidine]; Erythromycin; Toradol [ketorolac tromethamine]; and Tramadol   Review of Systems Review of Systems  Constitutional: Negative for chills and fever.  Gastrointestinal: Negative for nausea and vomiting.  Musculoskeletal: Positive for arthralgias.  Skin: Positive for wound. Negative for color change.  Neurological: Negative for weakness and numbness.     Physical Exam Updated Vital Signs BP (!) 128/100 Comment: pt states she hasn't had BP med yet today  Pulse 94   Temp 99.3 F (37.4 C) (Oral)   Resp 20   Ht 5\' 5"  (1.651 m)   Wt 104.3 kg (230 lb)   SpO2 99%   BMI 38.27 kg/m   Physical Exam  Constitutional: She appears well-developed and well-nourished. No distress.  HENT:  Head: Normocephalic and atraumatic.  Eyes: Right eye exhibits no discharge. Left eye exhibits no discharge. No scleral icterus.  Neck: Normal range of motion.  Cardiovascular: Intact distal pulses.   Pulmonary/Chest: No respiratory distress.  Musculoskeletal: Normal range of motion.  Blister to the left fourth toe that has opened and is dry. No drainage. No erythema or warmth. Mild tender to palpation. Full range of motion. Cap refill normal. DP pulses 2+ bilaterally. No overlying signs of infection.  Neurological: She is alert.  Skin: Skin is warm and dry. Capillary refill takes less than 2 seconds. No pallor.  Psychiatric: Her behavior is normal. Judgment and thought content normal.  Nursing note and vitals reviewed.    ED Treatments / Results  Labs (all labs ordered are listed, but only abnormal results are  displayed) Labs Reviewed - No data to display  EKG  EKG Interpretation None       Radiology Dg Foot Complete Left  Result Date: 07/26/2017 CLINICAL DATA:  Fourth toe blister, draining wound, diabetes EXAM: LEFT FOOT - COMPLETE 3+ VIEW COMPARISON:  None available FINDINGS: Normal alignment without acute osseous finding or fracture. No area of periostitis or osseous destruction. No radiopaque or metallic foreign body. Small plantar calcaneal spur. IMPRESSION: No acute finding by plain radiography Electronically Signed   By: Judie PetitM.  Shick M.D.   On: 07/26/2017 12:02    Procedures Procedures (including critical care time)  Medications Ordered in ED Medications - No data to display   Initial Impression / Assessment and Plan / ED Course  I have reviewed the triage vital signs and the nursing notes.  Pertinent labs & imaging results that were available during my care of the patient were  reviewed by me and considered in my medical decision making (see chart for details).     Patient resents to the ED with blister to her left fourth toe. No obvious infection noted. Full range of motion. No concern for septic joint. Doubt diabetic ulcer. Does have history of diabetes. Patient concern for infection given her drainage at home. X-ray is unremarkable. We will prescribe her prophylactic antibiotics with close follow-up with podiatrist. Patient is overall well-appearing and nontoxic.  Pt is hemodynamically stable, in NAD, & able to ambulate in the ED. Evaluation does not show pathology that would require ongoing emergent intervention or inpatient treatment. I explained the diagnosis to the patient. Pain has been managed & has no complaints prior to dc. Pt is comfortable with above plan and is stable for discharge at this time. All questions were answered prior to disposition. Strict return precautions for f/u to the ED were discussed. Encouraged follow up with PCP.   Final Clinical Impressions(s) / ED  Diagnoses   Final diagnoses:  Blister of toe of left foot without infection, initial encounter    New Prescriptions New Prescriptions   CEPHALEXIN (KEFLEX) 500 MG CAPSULE    Take 1 capsule (500 mg total) by mouth 4 (four) times daily.     Rise MuLeaphart, Kenneth T, PA-C 07/26/17 1213    Nira Connardama, Pedro Eduardo, MD 07/26/17 1606

## 2017-07-26 NOTE — Discharge Instructions (Signed)
X-ray was normal. Given her history of diabetes make she keep a close eye on the wound. Will need close follow-up with her podiatrist. Return to the ED if she develop any worsening symptoms including fever, worsening drainage, worsening redness, worsening pain.

## 2017-07-26 NOTE — ED Notes (Signed)
Patient transported to X-ray 

## 2017-07-26 NOTE — ED Notes (Signed)
ED Provider at bedside. 

## 2017-07-26 NOTE — ED Triage Notes (Addendum)
Pt reports left fourth toe wound with blisters. No recent pedicures. Area itching and painful. Pt known diabetic.

## 2018-10-26 ENCOUNTER — Emergency Department (HOSPITAL_BASED_OUTPATIENT_CLINIC_OR_DEPARTMENT_OTHER): Payer: 59

## 2018-10-26 ENCOUNTER — Other Ambulatory Visit: Payer: Self-pay

## 2018-10-26 ENCOUNTER — Encounter (HOSPITAL_BASED_OUTPATIENT_CLINIC_OR_DEPARTMENT_OTHER): Payer: Self-pay

## 2018-10-26 ENCOUNTER — Emergency Department (HOSPITAL_BASED_OUTPATIENT_CLINIC_OR_DEPARTMENT_OTHER)
Admission: EM | Admit: 2018-10-26 | Discharge: 2018-10-26 | Disposition: A | Discharge status: Home / Self Care | Payer: 59 | Attending: Emergency Medicine | Admitting: Emergency Medicine

## 2018-10-26 DIAGNOSIS — R079 Chest pain, unspecified: Secondary | ICD-10-CM | POA: Diagnosis present

## 2018-10-26 DIAGNOSIS — Z79899 Other long term (current) drug therapy: Secondary | ICD-10-CM | POA: Diagnosis not present

## 2018-10-26 DIAGNOSIS — E119 Type 2 diabetes mellitus without complications: Secondary | ICD-10-CM | POA: Diagnosis not present

## 2018-10-26 DIAGNOSIS — Z7984 Long term (current) use of oral hypoglycemic drugs: Secondary | ICD-10-CM | POA: Diagnosis not present

## 2018-10-26 DIAGNOSIS — I10 Essential (primary) hypertension: Secondary | ICD-10-CM | POA: Insufficient documentation

## 2018-10-26 DIAGNOSIS — Z955 Presence of coronary angioplasty implant and graft: Secondary | ICD-10-CM | POA: Diagnosis not present

## 2018-10-26 DIAGNOSIS — R0789 Other chest pain: Secondary | ICD-10-CM | POA: Diagnosis not present

## 2018-10-26 LAB — BASIC METABOLIC PANEL
ANION GAP: 11 (ref 5–15)
BUN: 16 mg/dL (ref 6–20)
CO2: 26 mmol/L (ref 22–32)
Calcium: 9.7 mg/dL (ref 8.9–10.3)
Chloride: 103 mmol/L (ref 98–111)
Creatinine, Ser: 0.77 mg/dL (ref 0.44–1.00)
GFR calc Af Amer: >60 mL/min (ref >60)
GFR calc non Af Amer: >60 mL/min (ref >60)
Glucose, Bld: 147 mg/dL — ABNORMAL HIGH (ref 70–99)
POTASSIUM: 3.4 mmol/L — AB (ref 3.5–5.1)
SODIUM: 140 mmol/L (ref 135–145)

## 2018-10-26 LAB — CBC
HCT: 38.7 % (ref 36.0–46.0)
HEMOGLOBIN: 12.3 g/dL (ref 12.0–15.0)
MCH: 28 pg (ref 26.0–34.0)
MCHC: 31.8 g/dL (ref 30.0–36.0)
MCV: 88 fL (ref 80.0–100.0)
Platelets: 317 10*3/uL (ref 150–400)
RBC: 4.4 MIL/uL (ref 3.87–5.11)
RDW: 13 % (ref 11.5–15.5)
WBC: 7.2 10*3/uL (ref 4.0–10.5)
nRBC: 0 % (ref 0.0–0.2)

## 2018-10-26 LAB — TROPONIN I
Troponin I: <0.03 ng/mL (ref <0.03)
Troponin I: <0.03 ng/mL (ref <0.03)

## 2018-10-26 MED ORDER — ACETAMINOPHEN 500 MG PO TABS
1000.0000 mg | ORAL_TABLET | Freq: Once | ORAL | Status: AC
  Administered 2018-10-26: 1000 mg via ORAL
  Filled 2018-10-26: qty 2

## 2018-10-26 MED ORDER — MORPHINE SULFATE (PF) 4 MG/ML IV SOLN: 4.0000 mg | Freq: Once | INTRAVENOUS | Status: DC

## 2018-10-26 MED ORDER — HYDROCODONE-ACETAMINOPHEN 5-325 MG PO TABS: 1.0000 | ORAL_TABLET | ORAL | 0 refills | Status: DC | PRN

## 2018-10-26 MED ORDER — IOPAMIDOL (ISOVUE-370) INJECTION 76%
100.0000 mL | Freq: Once | INTRAVENOUS | Status: AC | PRN
  Administered 2018-10-26: 100 mL via INTRAVENOUS

## 2018-10-26 MED ORDER — ONDANSETRON HCL 4 MG/2ML IJ SOLN: 4.0000 mg | Freq: Once | INTRAMUSCULAR | Status: DC

## 2018-10-26 NOTE — ED Provider Notes (Signed)
[facility] EMERGENCY DEPARTMENT Provider Note   CSN: [id number] Arrival date & time: 10/26/18  1926     History   Chief Complaint Chief Complaint  Patient presents with  . Chest Pain    HPI Leslie Gentry is a 47 y.o. female.  Pt presents to the ED today with cp.  She said it's on the left side of her chest.  It is sharp.  She does have a hx of CAD and has had 2 stents.  This pain does not feel like those chest pains.  The pt did have a stress test by pcp in August which was ok.     Past Medical History:  Diagnosis Date  . Anemia   . Chronic back pain   . Diabetes mellitus   . Gallstones   . H/O heart artery stent 05/15/2017  . H/O hiatal hernia   . Headache(784.0)    hx of migraines   . Hypertension   . Migraine     Patient Active Problem List   Diagnosis Date Noted  . Low back pain 11/16/2015  . Hernia, incisional 05/25/2015  . Type 2 diabetes mellitus (HCC) 11/11/2014  . Acid reflux 06/05/2013  . Essential (primary) hypertension 06/05/2013  . Class 2 obesity 05/27/2013  . Hypercholesterolemia 05/27/2013  . Cyst (solitary) of breast 11/24/2011  . H/O total hysterectomy 11/24/2011  . Acquired cyst of kidney 11/24/2011  . Clinical depression 11/24/2011    Past Surgical History:  Procedure Laterality Date  . ABDOMINAL HYSTERECTOMY    . APPENDECTOMY    . CESAREAN SECTION    . CHOLECYSTECTOMY N/A 12/28/2013   Procedure: LAPAROSCOPIC CHOLECYSTECTOMY WITH INTRAOPERATIVE CHOLANGIOGRAM;  Surgeon: Romie Levee, MD;  Location: [facility];  Service: General;  Laterality: N/A;  . CORONARY BALLOON ANGIOPLASTY    . CORONARY STENT INTERVENTION    . HERNIA REPAIR    . RIGHT AND LEFT HEART CATH    . TONSILLECTOMY       OB History   None      Home Medications    Prior to Admission medications   Medication Sig Start Date End Date Taking? Authorizing Provider  gabapentin (NEURONTIN) 300 MG capsule Take 300 mg by mouth 3 (three) times daily.    Yes [provider]  glipiZIDE (GLUCOTROL) 5 MG tablet Take by mouth daily before breakfast.   Yes [provider]  losartan (COZAAR) 25 MG tablet Take 50 mg by mouth daily.    Yes [provider]  metFORMIN (GLUCOPHAGE) 1000 MG tablet Take 1,000 mg by mouth 2 (two) times daily with a meal.   Yes [provider]  ticagrelor (BRILINTA) 90 MG TABS tablet Take by mouth 2 (two) times daily.   Yes [provider]  TiZANidine HCl (ZANAFLEX PO) Take by mouth.   Yes [provider]  amitriptyline (ELAVIL) 10 MG tablet Take 40 mg by mouth at bedtime.    [provider]  amLODipine (NORVASC) 10 MG tablet Take 10 mg by mouth daily.    [provider]  cephALEXin (KEFLEX) 500 MG capsule Take 1 capsule (500 mg total) by mouth 4 (four) times daily. 07/26/17   Rise Mu, PA-C  HYDROcodone-acetaminophen (NORCO/VICODIN) 5-325 MG tablet Take 1 tablet by mouth every 4 (four) hours as needed. 10/26/18   Jacalyn Lefevre, MD  metaxalone (SKELAXIN) 800 MG tablet Take 1 tablet (800 mg total) by mouth 3 (three) times daily. 01/08/17   Lattie Haw, MD  methocarbamol (  ROBAXIN) 500 MG tablet Take 500 mg by mouth 4 (four) times daily.    [provider]  naproxen (NAPROSYN) 500 MG tablet Take 1 tablet (500 mg total) by mouth 2 (two) times daily. (every 12 hours with food) 01/08/17   Lattie Haw, MD  ondansetron (ZOFRAN ODT) 4 MG disintegrating tablet Take 1 tablet (4 mg total) by mouth every 8 (eight) hours as needed for nausea or vomiting. 08/01/16   Santiago Glad, PA-C    Family History No family history on file.  Social History Social History   Tobacco Use  . Smoking status: Never Smoker  . Smokeless tobacco: Never Used  Substance Use Topics  . Alcohol use: No  . Drug use: No     Allergies   Ketorolac; Chlorhexidine; Demerol [meperidine]; Erythromycin; Toradol [ketorolac tromethamine]; and Tramadol   Review of  Systems Review of Systems  Cardiovascular: Positive for chest pain.  All other systems reviewed and are negative.    Physical Exam Updated Vital Signs BP (!) 166/113   Pulse 73   Temp 98.3 F (36.8 C) (Oral)   Resp 17   Ht 5\' 5"  (1.651 m)   Wt 99.8 kg   SpO2 96%   BMI 36.61 kg/m   Physical Exam  Constitutional: She is oriented to person, place, and time. She appears well-developed and well-nourished.  HENT:  Head: Normocephalic and atraumatic.  Eyes: Pupils are equal, round, and reactive to light. EOM are normal.  Neck: Normal range of motion. Neck supple.  Cardiovascular: Normal rate, regular rhythm, intact distal pulses and normal pulses.  Pulmonary/Chest: Effort normal and breath sounds normal.  Abdominal: Soft. Bowel sounds are normal.  Musculoskeletal: Normal range of motion.       Right lower leg: Normal.       Left lower leg: Normal.  Neurological: She is alert and oriented to person, place, and time.  Skin: Skin is warm and dry. Capillary refill takes less than 2 seconds.  Psychiatric: She has a normal mood and affect. Her behavior is normal.  Nursing note and vitals reviewed.    ED Treatments / Results  Labs (all labs ordered are listed, but only abnormal results are displayed) Labs Reviewed  BASIC METABOLIC PANEL - Abnormal; Notable for the following components:      Result Value   Potassium 3.4 (*)    Glucose, Bld 147 (*)    All other components within normal limits  CBC  TROPONIN I  TROPONIN I    EKG EKG Interpretation  Date/Time:  Monday October 26 2018 19:35:08 EDT Ventricular Rate:  86 PR Interval:  196 QRS Duration: 84 QT Interval:  368 QTC Calculation: 440 R Axis:   15 Text Interpretation:  Normal sinus rhythm Normal ECG Confirmed by Jacalyn Lefevre [phone]) on 10/26/2018 8:31:36 PM   Radiology Dg Chest 2 View  Result Date: 10/26/2018 CLINICAL DATA:  Onset of left-sided chest pain today and facial pressure. EXAM: CHEST - 2 VIEW  COMPARISON:  05/12/2017 FINDINGS: The heart size and mediastinal contours are within normal limits. Mild uncoiling of the thoracic aorta. Both lungs are clear. The visualized skeletal structures are unremarkable. IMPRESSION: Stable appearance of the cardiac and mediastinal contours. No active cardiopulmonary disease. Electronically Signed   By: Tollie Eth M.D.   On: 10/26/2018 19:53   Ct Angio Chest Pe W And/or Wo Contrast  Result Date: 10/26/2018 CLINICAL DATA:  47 year old female with acute chest pain today. EXAM: CT ANGIOGRAPHY CHEST WITH CONTRAST TECHNIQUE:  Multidetector CT imaging of the chest was performed using the standard protocol during bolus administration of intravenous contrast. Multiplanar CT image reconstructions and MIPs were obtained to evaluate the vascular anatomy. CONTRAST:  ISOVUE-370 IOPAMIDOL (ISOVUE-370) INJECTION 76% COMPARISON:  07/23/2016 chest CT, 10/26/2018 chest radiograph and prior studies FINDINGS: Cardiovascular: This is a technically satisfactory study. No pulmonary emboli are identified. Mild cardiomegaly identified. Coronary artery atherosclerotic calcifications noted as well as a LAD stent. No thoracic aortic aneurysm or pericardial effusion. Mediastinum/Nodes: No enlarged mediastinal, hilar, or axillary lymph nodes. Thyroid gland, trachea, and esophagus demonstrate no significant findings. Lungs/Pleura: Lungs are clear except for a small stable benign calcified LEFT LOWER lobe nodule. No pleural effusion or pneumothorax. Upper Abdomen: No acute abnormality Musculoskeletal: No acute or suspicious bony abnormalities. Review of the MIP images confirms the above findings. IMPRESSION: 1. No evidence of acute abnormality no evidence of pulmonary emboli. 2. Cardiomegaly and coronary artery disease. Electronically Signed   By: Harmon Pier M.D.   On: 10/26/2018 21:27    Procedures Procedures (including critical care time)  Medications Ordered in ED Medications    acetaminophen (TYLENOL) tablet 1,000 mg (1,000 mg Oral Given 10/26/18 2050)  iopamidol (ISOVUE-370) 76 % injection 100 mL (100 mLs Intravenous Contrast Given 10/26/18 2106)     Initial Impression / Assessment and Plan / ED Course  I have reviewed the triage vital signs and the nursing notes.  Pertinent labs & imaging results that were available during my care of the patient were reviewed by me and considered in my medical decision making (see chart for details).    2 troponins negative.  Nl ekg.  Recent stress.  No PE.  CP seems pleuritic.   Pt to be d/c home to f/u with pcp and with cardiology.  Return if worse.  Final Clinical Impressions(s) / ED Diagnoses   Final diagnoses:  Atypical chest pain    ED Discharge Orders         Ordered    HYDROcodone-acetaminophen (NORCO/VICODIN) 5-325 MG tablet  Every 4 hours PRN     10/26/18 2247           Jacalyn Lefevre, MD 10/26/18 2253

## 2018-10-26 NOTE — ED Triage Notes (Signed)
C/o CP x today-NAD-steady gait 

## 2018-10-26 NOTE — ED Notes (Signed)
C/o left cp x 2 hours  Sharp-stabbing-dull,  Denies n/v/  No sob  No radiating

## 2019-11-08 ENCOUNTER — Other Ambulatory Visit (HOSPITAL_COMMUNITY)
Admission: RE | Admit: 2019-11-08 | Discharge: 2019-11-08 | Disposition: A | Discharge status: Home / Self Care | Payer: 59 | Source: Other Acute Inpatient Hospital | Attending: Otolaryngology | Admitting: Otolaryngology

## 2019-11-08 DIAGNOSIS — J322 Chronic ethmoidal sinusitis: Secondary | ICD-10-CM | POA: Insufficient documentation

## 2019-11-08 LAB — COMPREHENSIVE METABOLIC PANEL
ALT: 36 U/L (ref 0–44)
AST: 38 U/L (ref 15–41)
Albumin: 3.7 g/dL (ref 3.5–5.0)
Alkaline Phosphatase: 88 U/L (ref 38–126)
Anion gap: 15 (ref 5–15)
BUN: 8 mg/dL (ref 6–20)
CO2: 23 mmol/L (ref 22–32)
Calcium: 8.8 mg/dL — ABNORMAL LOW (ref 8.9–10.3)
Chloride: 99 mmol/L (ref 98–111)
Creatinine, Ser: 0.66 mg/dL (ref 0.44–1.00)
GFR calc Af Amer: >60 mL/min (ref >60)
GFR calc non Af Amer: >60 mL/min (ref >60)
Glucose, Bld: 223 mg/dL — ABNORMAL HIGH (ref 70–99)
Potassium: 3.7 mmol/L (ref 3.5–5.1)
Sodium: 137 mmol/L (ref 135–145)
Total Bilirubin: 0.5 mg/dL (ref 0.3–1.2)
Total Protein: 7 g/dL (ref 6.5–8.1)

## 2019-11-08 LAB — CBC
HCT: 35.9 % — ABNORMAL LOW (ref 36.0–46.0)
Hemoglobin: 11.4 g/dL — ABNORMAL LOW (ref 12.0–15.0)
MCH: 28.2 pg (ref 26.0–34.0)
MCHC: 31.8 g/dL (ref 30.0–36.0)
MCV: 88.9 fL (ref 80.0–100.0)
Platelets: 289 10*3/uL (ref 150–400)
RBC: 4.04 MIL/uL (ref 3.87–5.11)
RDW: 13.8 % (ref 11.5–15.5)
WBC: 5.6 10*3/uL (ref 4.0–10.5)
nRBC: 0 % (ref 0.0–0.2)

## 2020-11-15 ENCOUNTER — Emergency Department (HOSPITAL_BASED_OUTPATIENT_CLINIC_OR_DEPARTMENT_OTHER)
Admission: EM | Admit: 2020-11-15 | Discharge: 2020-11-15 | Disposition: A | Discharge status: Home / Self Care | Payer: 59 | Attending: Emergency Medicine | Admitting: Emergency Medicine

## 2020-11-15 ENCOUNTER — Encounter (HOSPITAL_BASED_OUTPATIENT_CLINIC_OR_DEPARTMENT_OTHER): Payer: Self-pay

## 2020-11-15 ENCOUNTER — Other Ambulatory Visit: Payer: Self-pay

## 2020-11-15 DIAGNOSIS — Z794 Long term (current) use of insulin: Secondary | ICD-10-CM | POA: Diagnosis not present

## 2020-11-15 DIAGNOSIS — Z95828 Presence of other vascular implants and grafts: Secondary | ICD-10-CM | POA: Diagnosis not present

## 2020-11-15 DIAGNOSIS — M2550 Pain in unspecified joint: Secondary | ICD-10-CM | POA: Diagnosis not present

## 2020-11-15 DIAGNOSIS — E119 Type 2 diabetes mellitus without complications: Secondary | ICD-10-CM | POA: Insufficient documentation

## 2020-11-15 DIAGNOSIS — Z79899 Other long term (current) drug therapy: Secondary | ICD-10-CM | POA: Insufficient documentation

## 2020-11-15 DIAGNOSIS — M25512 Pain in left shoulder: Secondary | ICD-10-CM | POA: Diagnosis not present

## 2020-11-15 DIAGNOSIS — I10 Essential (primary) hypertension: Secondary | ICD-10-CM | POA: Diagnosis not present

## 2020-11-15 DIAGNOSIS — M79642 Pain in left hand: Secondary | ICD-10-CM | POA: Diagnosis present

## 2020-11-15 HISTORY — DX: Rheumatoid arthritis, unspecified: M06.9

## 2020-11-15 HISTORY — DX: Cranial cerebrospinal fluid leak, spontaneous: G96.01

## 2020-11-15 HISTORY — DX: Meningitis, unspecified: G03.9

## 2020-11-15 MED ORDER — DICLOFENAC SODIUM 1 % EX GEL: 2.0000 g | Freq: Four times a day (QID) | CUTANEOUS | 0 refills | Status: DC

## 2020-11-15 NOTE — Discharge Instructions (Addendum)
Use the antiinflammatory gel as directed to help with your symptoms.   Please follow up with your primary doctor within the next 7-10 days for re-evaluation and further treatment of your symptoms.   Please return to the ER sooner if you have any new or worsening symptoms.

## 2020-11-15 NOTE — ED Provider Notes (Signed)
[facility] EMERGENCY DEPARTMENT Provider Note   CSN: [id number] Arrival date & time: 11/15/20  1659     History Chief Complaint  Patient presents with   Hand Pain    Leslie Gentry is a 49 y.o. female.  HPI   49 year old female with a history of anemia, CSF leak, diabetes, gallstones, headache, hypertension, rheumatoid arthritis, who presents the emergency department today for evaluation of pain in the left hand and left shoulder.  States she woke up with pain to both of these areas.  She has some redness to the MCP joint of the left hand as well.  Pain in the shoulder is worse with movement. There is no warmth or swelling noted.  She denies any injury or trauma.  Denies any fevers.  Past Medical History:  Diagnosis Date   Anemia    Chronic back pain    CSF leak from nose    Diabetes mellitus    Gallstones    H/O heart artery stent 05/15/2017   H/O hiatal hernia    Headache(784.0)    hx of migraines    Hypertension    Meningitis    Migraine    Rheumatoid arthritis [facility])     Patient Active Problem List   Diagnosis Date Noted   Low back pain 11/16/2015   Hernia, incisional 05/25/2015   Type 2 diabetes mellitus (HCC) 11/11/2014   Acid reflux 06/05/2013   Essential (primary) hypertension 06/05/2013   Class 2 obesity 05/27/2013   Hypercholesterolemia 05/27/2013   Cyst (solitary) of breast 11/24/2011   H/O total hysterectomy 11/24/2011   Acquired cyst of kidney 11/24/2011   Clinical depression 11/24/2011    Past Surgical History:  Procedure Laterality Date   ABDOMINAL HYSTERECTOMY     APPENDECTOMY     CESAREAN SECTION     CHOLECYSTECTOMY N/A 12/28/2013   Procedure: LAPAROSCOPIC CHOLECYSTECTOMY WITH INTRAOPERATIVE CHOLANGIOGRAM;  Surgeon: Romie Levee, MD;  Location: [facility];  Service: General;  Laterality: N/A;   CORONARY BALLOON ANGIOPLASTY     CORONARY STENT INTERVENTION     HERNIA REPAIR     HIP SURGERY      RIGHT AND LEFT HEART CATH     TONSILLECTOMY       OB History   No obstetric history on file.     No family history on file.  Social History   Tobacco Use   Smoking status: Never Smoker   Smokeless tobacco: Never Used  Vaping Use   Vaping Use: Never used  Substance Use Topics   Alcohol use: No   Drug use: No    Home Medications Prior to Admission medications   Medication Sig Start Date End Date Taking? Authorizing Provider  amitriptyline (ELAVIL) 10 MG tablet Take 40 mg by mouth at bedtime.    [provider]  amLODipine (NORVASC) 10 MG tablet Take 10 mg by mouth daily.    [provider]  cephALEXin (KEFLEX) 500 MG capsule Take 1 capsule (500 mg total) by mouth 4 (four) times daily. 07/26/17   Rise Mu, PA-C  diclofenac Sodium (VOLTAREN) 1 % GEL Apply 2 g topically 4 (four) times daily. 11/15/20   Marialena Wollen S, PA-C  gabapentin (NEURONTIN) 300 MG capsule Take 300 mg by mouth 3 (three) times daily.    [provider]  glipiZIDE (GLUCOTROL) 5 MG tablet Take by mouth daily before breakfast.    [provider]  HYDROcodone-acetaminophen (NORCO/VICODIN) 5-325 MG tablet Take 1 tablet by mouth every  4 (four) hours as needed. 10/26/18   Jacalyn Lefevre, MD  losartan (COZAAR) 25 MG tablet Take 50 mg by mouth daily.     [provider]  metaxalone (SKELAXIN) 800 MG tablet Take 1 tablet (800 mg total) by mouth 3 (three) times daily. 01/08/17   Lattie Haw, MD  metFORMIN (GLUCOPHAGE) 1000 MG tablet Take 1,000 mg by mouth 2 (two) times daily with a meal.    [provider]  methocarbamol (ROBAXIN) 500 MG tablet Take 500 mg by mouth 4 (four) times daily.    [provider]  naproxen (NAPROSYN) 500 MG tablet Take 1 tablet (500 mg total) by mouth 2 (two) times daily. (every 12 hours with food) 01/08/17   Lattie Haw, MD  ondansetron (ZOFRAN ODT) 4 MG disintegrating tablet Take 1 tablet (4 mg total) by  mouth every 8 (eight) hours as needed for nausea or vomiting. 08/01/16   Santiago Glad, PA-C  ticagrelor (BRILINTA) 90 MG TABS tablet Take by mouth 2 (two) times daily.    [provider]  TiZANidine HCl (ZANAFLEX PO) Take by mouth.    [provider]    Allergies    Ketorolac, Chlorhexidine, Demerol [meperidine], Erythromycin, Toradol [ketorolac tromethamine], and Tramadol  Review of Systems   Review of Systems  Musculoskeletal:       Left shoulder and left hand pain  Neurological: Negative for weakness and numbness.    Physical Exam Updated Vital Signs BP (!) 131/96 (BP Location: Right Arm)    Pulse 100    Temp 98.4 F (36.9 C) (Oral)    Resp 18    Ht 5\' 5"  (1.651 m)    Wt 96.6 kg    SpO2 100%    BMI 35.45 kg/m   Physical Exam Vitals and nursing note reviewed.  Constitutional:      General: She is not in acute distress.    Appearance: She is well-developed.  HENT:     Head: Normocephalic and atraumatic.  Eyes:     Conjunctiva/sclera: Conjunctivae normal.  Cardiovascular:     Rate and Rhythm: Normal rate.  Pulmonary:     Effort: Pulmonary effort is normal.  Musculoskeletal:        General: Normal range of motion.     Cervical back: Neck supple.     Comments: Diffuse TTP to the left shoulder.  There is no erythema, warmth or swelling.  She has pain with range of motion but range of motion is intact.  TTP noted over the left first MTP joint with some overlying erythema to the palmar aspect of the hand.  There is no warmth noted and no swelling noted.  Range of motion seems largely intact to this area.  Skin:    General: Skin is warm and dry.  Neurological:     Mental Status: She is alert.     ED Results / Procedures / Treatments   Labs (all labs ordered are listed, but only abnormal results are displayed) Labs Reviewed - No data to display  EKG None  Radiology No results found.  Procedures Procedures (including critical care  time)  Medications Ordered in ED Medications - No data to display  ED Course  I have reviewed the triage vital signs and the nursing notes.  Pertinent labs & imaging results that were available during my care of the patient were reviewed by me and considered in my medical decision making (see chart for details).    MDM Rules/Calculators/A&P  49 year old female presenting for evaluation of left shoulder pain and pain in the left hand.  She does have a history of rheumatoid arthritis.  On chart review it appears that she has been seen for a lot of similar complaints in the past by her rheumatologist.  She is on multiple medications to manage her pain from this.  I suspect that her symptoms today are a flareup of her rheumatoid arthritis.  She has a documented history of poorly controlled diabetes therefore will avoid prednisone in this situation.  I recommended a course of anti-inflammatories.  She is already on narcotic medications for pain.  Advise close follow-up with PCP following course of anti-inflammatories and advised on strict return precautions.  She voices understanding plan reasons to return.  All questions answered.  Patient stable for discharge.   Final Clinical Impression(s) / ED Diagnoses Final diagnoses:  Arthralgia, unspecified joint    Rx / DC Orders ED Discharge Orders         Ordered    diclofenac Sodium (VOLTAREN) 1 % GEL  4 times daily        11/15/20 1754           Guiliana Shor, [employer], PA-C 11/15/20 1949    Milagros Loll, MD 11/17/20 [phone]

## 2020-11-15 NOTE — ED Triage Notes (Signed)
Pt states she woke this am with pain to right palm- c/o "red and inflamed"-denies injury

## 2020-11-15 NOTE — ED Notes (Signed)
Left palm close to her thumb is red and stated that it is tender to touch.  Also stated that it hurts so bad to move her left shoulder.

## 2021-01-21 ENCOUNTER — Other Ambulatory Visit: Payer: Self-pay

## 2021-01-21 ENCOUNTER — Emergency Department (HOSPITAL_BASED_OUTPATIENT_CLINIC_OR_DEPARTMENT_OTHER): Payer: 59

## 2021-01-21 ENCOUNTER — Emergency Department (HOSPITAL_BASED_OUTPATIENT_CLINIC_OR_DEPARTMENT_OTHER)
Admission: EM | Admit: 2021-01-21 | Discharge: 2021-01-21 | Disposition: A | Discharge status: Home / Self Care | Payer: 59 | Attending: Emergency Medicine | Admitting: Emergency Medicine

## 2021-01-21 ENCOUNTER — Encounter (HOSPITAL_BASED_OUTPATIENT_CLINIC_OR_DEPARTMENT_OTHER): Payer: Self-pay | Admitting: Emergency Medicine

## 2021-01-21 DIAGNOSIS — Z79899 Other long term (current) drug therapy: Secondary | ICD-10-CM | POA: Insufficient documentation

## 2021-01-21 DIAGNOSIS — Z7984 Long term (current) use of oral hypoglycemic drugs: Secondary | ICD-10-CM | POA: Diagnosis not present

## 2021-01-21 DIAGNOSIS — I10 Essential (primary) hypertension: Secondary | ICD-10-CM | POA: Insufficient documentation

## 2021-01-21 DIAGNOSIS — M25512 Pain in left shoulder: Secondary | ICD-10-CM | POA: Diagnosis present

## 2021-01-21 DIAGNOSIS — E119 Type 2 diabetes mellitus without complications: Secondary | ICD-10-CM | POA: Insufficient documentation

## 2021-01-21 DIAGNOSIS — W010XXA Fall on same level from slipping, tripping and stumbling without subsequent striking against object, initial encounter: Secondary | ICD-10-CM | POA: Diagnosis not present

## 2021-01-21 DIAGNOSIS — M19012 Primary osteoarthritis, left shoulder: Secondary | ICD-10-CM | POA: Diagnosis not present

## 2021-01-21 DIAGNOSIS — M19112 Post-traumatic osteoarthritis, left shoulder: Secondary | ICD-10-CM

## 2021-01-21 NOTE — ED Notes (Signed)
States she fell yesterday am getting out of her vehicle slipped on ice and rec injury to left shoulder, rates pain a 10 on 0-10 scale, able to grip left hand very well, denies any numbness or tingling of LUE, color wnl, capillary refill wnl as well, warm to touch, ice pack on left shoulder. Radiological studies done.

## 2021-01-21 NOTE — ED Triage Notes (Signed)
Pt arrives pov with c/o of fall yesterday on ice  and reports left shoulder pain. Pain denies head injury

## 2021-01-21 NOTE — Discharge Instructions (Addendum)
Please read instructions below. Apply ice to your shoulder for 20 minutes at a time. You can take tylenol every 6 hours as needed for pain. You can apply topical voltaren gel to your area of pain as directed. Schedule an appointment with the sports medicine physician for follow-up on your injury if symptoms persist. Return to the ER for new or concerning symptoms.

## 2021-01-21 NOTE — ED Provider Notes (Signed)
[facility] EMERGENCY DEPARTMENT Provider Note   CSN: [id number] Arrival date & time: 01/21/21  1610     History Chief Complaint  Patient presents with  . Fall    Leslie Gentry is a 50 y.o. female presenting to the emergency department with complaint of sudden onset of left shoulder pain after mechanical fall that occurred yesterday.  She states she was getting out of her vehicle and slipped on ice.  She fell directly onto her left shoulder.  She has been having pain to the left shoulder since that time.  No numbness or weakness of her extremity.  No wounds.  No head trauma or LOC.  No other injuries reported.  Not on anticoagulation.  She has been treating with her prescribed oxycodone for pain.  Prior injuries to left shoulder  The history is provided by the patient.       Past Medical History:  Diagnosis Date  . Anemia   . Chronic back pain   . CSF leak from nose   . Diabetes mellitus   . Gallstones   . H/O heart artery stent 05/15/2017  . H/O hiatal hernia   . Headache(784.0)    hx of migraines   . Hypertension   . Meningitis   . Migraine   . Rheumatoid arthritis [facility])     Patient Active Problem List   Diagnosis Date Noted  . Low back pain 11/16/2015  . Hernia, incisional 05/25/2015  . Type 2 diabetes mellitus (HCC) 11/11/2014  . Acid reflux 06/05/2013  . Essential (primary) hypertension 06/05/2013  . Class 2 obesity 05/27/2013  . Hypercholesterolemia 05/27/2013  . Cyst (solitary) of breast 11/24/2011  . H/O total hysterectomy 11/24/2011  . Acquired cyst of kidney 11/24/2011  . Clinical depression 11/24/2011    Past Surgical History:  Procedure Laterality Date  . ABDOMINAL HYSTERECTOMY    . APPENDECTOMY    . CESAREAN SECTION    . CHOLECYSTECTOMY N/A 12/28/2013   Procedure: LAPAROSCOPIC CHOLECYSTECTOMY WITH INTRAOPERATIVE CHOLANGIOGRAM;  Surgeon: Romie Levee, MD;  Location: [facility] ORS;  Service: General;  Laterality: N/A;  . CORONARY  BALLOON ANGIOPLASTY    . CORONARY STENT INTERVENTION    . HERNIA REPAIR    . HIP SURGERY    . RIGHT AND LEFT HEART CATH    . TONSILLECTOMY       OB History   No obstetric history on file.     History reviewed. No pertinent family history.  Social History   Tobacco Use  . Smoking status: Never Smoker  . Smokeless tobacco: Never Used  Vaping Use  . Vaping Use: Never used  Substance Use Topics  . Alcohol use: No  . Drug use: No    Home Medications Prior to Admission medications   Medication Sig Start Date End Date Taking? Authorizing Provider  amitriptyline (ELAVIL) 10 MG tablet Take 40 mg by mouth at bedtime.    [provider]  amLODipine (NORVASC) 10 MG tablet Take 10 mg by mouth daily.    [provider]  cephALEXin (KEFLEX) 500 MG capsule Take 1 capsule (500 mg total) by mouth 4 (four) times daily. 07/26/17   Rise Mu, PA-C  diclofenac Sodium (VOLTAREN) 1 % GEL Apply 2 g topically 4 (four) times daily. 11/15/20   Couture, Cortni S, PA-C  gabapentin (NEURONTIN) 300 MG capsule Take 300 mg by mouth 3 (three) times daily.    [provider]  glipiZIDE (GLUCOTROL) 5 MG tablet Take by mouth  daily before breakfast.    [provider]  HYDROcodone-acetaminophen (NORCO/VICODIN) 5-325 MG tablet Take 1 tablet by mouth every 4 (four) hours as needed. 10/26/18   Jacalyn Lefevre, MD  losartan (COZAAR) 25 MG tablet Take 50 mg by mouth daily.     [provider]  metaxalone (SKELAXIN) 800 MG tablet Take 1 tablet (800 mg total) by mouth 3 (three) times daily. 01/08/17   Lattie Haw, MD  metFORMIN (GLUCOPHAGE) 1000 MG tablet Take 1,000 mg by mouth 2 (two) times daily with a meal.    [provider]  methocarbamol (ROBAXIN) 500 MG tablet Take 500 mg by mouth 4 (four) times daily.    [provider]  naproxen (NAPROSYN) 500 MG tablet Take 1 tablet (500 mg total) by mouth 2 (two) times daily. (every 12 hours with food)  01/08/17   Lattie Haw, MD  ondansetron (ZOFRAN ODT) 4 MG disintegrating tablet Take 1 tablet (4 mg total) by mouth every 8 (eight) hours as needed for nausea or vomiting. 08/01/16   Santiago Glad, PA-C  ticagrelor (BRILINTA) 90 MG TABS tablet Take by mouth 2 (two) times daily.    [provider]  TiZANidine HCl (ZANAFLEX PO) Take by mouth.    [provider]    Allergies    Ketorolac, Chlorhexidine, Demerol [meperidine], Erythromycin, Toradol [ketorolac tromethamine], and Tramadol  Review of Systems   Review of Systems  Musculoskeletal: Positive for arthralgias.  All other systems reviewed and are negative.   Physical Exam Updated Vital Signs BP (!) 109/91 (BP Location: Right Arm)   Pulse 88   Temp 98.3 F (36.8 C) (Oral)   Resp 18   Ht 5\' 5"  (1.651 m)   Wt 94.3 kg   SpO2 100%   BMI 34.61 kg/m   Physical Exam Vitals and nursing note reviewed.  Constitutional:      General: She is not in acute distress.    Appearance: She is well-developed.  HENT:     Head: Normocephalic and atraumatic.  Eyes:     Conjunctiva/sclera: Conjunctivae normal.  Cardiovascular:     Rate and Rhythm: Normal rate.  Pulmonary:     Effort: Pulmonary effort is normal.  Musculoskeletal:     Comments: Left shoulder without swelling or deformity.  No bruising or wounds.  There is some generalized tenderness about the left shoulder joint.  Pain with range of motion in all directions.  Strong and equal grip strength bilateral upper extremities. Ranging neck without difficulty or pain  Neurological:     Mental Status: She is alert.  Psychiatric:        Mood and Affect: Mood normal.        Behavior: Behavior normal.     ED Results / Procedures / Treatments   Labs (all labs ordered are listed, but only abnormal results are displayed) Labs Reviewed - No data to display  EKG None  Radiology DG Shoulder Left  Result Date: 01/21/2021 CLINICAL DATA:  fall, injury, pain  EXAM: LEFT SHOULDER - 2+ VIEW COMPARISON:  03/26/1999 report. FINDINGS: There is no evidence of fracture or dislocation. There is no evidence of arthropathy or other focal bone abnormality. Soft tissues are unremarkable. IMPRESSION: No acute osseous abnormality. Electronically Signed   By: 03/28/1999 M.D.   On: 01/21/2021 17:15    Procedures Procedures (including critical care time)  Medications Ordered in ED Medications - No data to display  ED Course  I have reviewed the triage vital  signs and the nursing notes.  Pertinent labs & imaging results that were available during my care of the patient were reviewed by me and considered in my medical decision making (see chart for details).    MDM Rules/Calculators/A&P                          Patient with left shoulder pain after mechanical fall from slipping on the ice yesterday.  No head trauma.  Not on anticoagulation.  Neurovascular intact.  X-rays negative for acute fracture.  Will place in sling for comfort and support.  Recommend continue pain medication as directed.  She can use topical Voltaren gel, ice 20 minutes at a time.  Sports medicine referral provided for follow-up patient is appropriate for discharge  Discussed results, findings, treatment and follow up. Patient advised of return precautions. Patient verbalized understanding and agreed with plan.  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

## 2021-01-23 ENCOUNTER — Ambulatory Visit: Payer: Self-pay

## 2021-01-23 ENCOUNTER — Other Ambulatory Visit: Payer: Self-pay

## 2021-01-23 ENCOUNTER — Ambulatory Visit (INDEPENDENT_AMBULATORY_CARE_PROVIDER_SITE_OTHER): Payer: 59 | Admitting: Family Medicine

## 2021-01-23 ENCOUNTER — Telehealth: Payer: Self-pay | Admitting: Family Medicine

## 2021-01-23 VITALS — BP 120/70 | Ht 65.0 in | Wt 208.0 lb

## 2021-01-23 DIAGNOSIS — S43002A Unspecified subluxation of left shoulder joint, initial encounter: Secondary | ICD-10-CM

## 2021-01-23 DIAGNOSIS — M25512 Pain in left shoulder: Secondary | ICD-10-CM

## 2021-01-23 MED ORDER — PREDNISONE 5 MG PO TABS: ORAL_TABLET | ORAL | 0 refills | Status: DC

## 2021-01-23 NOTE — Patient Instructions (Signed)
Nice to meet you Please try ice  Please try the range of motion   Please send me a message in MyChart with any questions or updates.  Please see me back in 3-4 weeks.   --Dr. Jordan Likes

## 2021-01-23 NOTE — Telephone Encounter (Signed)
Called pt to offer ED follow up appt w/Dr.Schmitz--no answer, unable to lv message-VMB full--will cb later.  --glh

## 2021-01-23 NOTE — Assessment & Plan Note (Signed)
Injury occurred on 1/22.  Has global pain seems more likely a subluxation as rotator cuff appears to be in good shape. -Counseled on home exercise therapy and supportive care. -Counseled on sling. -Prednisone. -Could consider injection or physical therapy.

## 2021-01-23 NOTE — Progress Notes (Signed)
Leslie Gentry - 50 y.o. female MRN [id number]  Date of birth: 05/26/1971  SUBJECTIVE:  Including CC & ROS.  No chief complaint on file.   Leslie Gentry is a 50 y.o. female that is presenting with left shoulder pain.  She had a fall and landed on the left shoulder on some ice.  Since that time she has had pain globally around the shoulder.  No history of similar pain.  Pain seems to be getting worse.  Has pain with abduction and external rotation..  Independent review of the left shoulder x-ray from 1/23 shows no acute changes.   Review of Systems See HPI   HISTORY: Past Medical, Surgical, Social, and Family History Reviewed & Updated per EMR.   Pertinent Historical Findings include:  Past Medical History:  Diagnosis Date  . Anemia   . Chronic back pain   . CSF leak from nose   . Diabetes mellitus   . Gallstones   . H/O heart artery stent 05/15/2017  . H/O hiatal hernia   . Headache(784.0)    hx of migraines   . Hypertension   . Meningitis   . Migraine   . Rheumatoid arthritis [facility])     Past Surgical History:  Procedure Laterality Date  . ABDOMINAL HYSTERECTOMY    . APPENDECTOMY    . CESAREAN SECTION    . CHOLECYSTECTOMY N/A 12/28/2013   Procedure: LAPAROSCOPIC CHOLECYSTECTOMY WITH INTRAOPERATIVE CHOLANGIOGRAM;  Surgeon: Romie Levee, MD;  Location: [facility];  Service: General;  Laterality: N/A;  . CORONARY BALLOON ANGIOPLASTY    . CORONARY STENT INTERVENTION    . HERNIA REPAIR    . HIP SURGERY    . RIGHT AND LEFT HEART CATH    . TONSILLECTOMY      No family history on file.  Social History   Socioeconomic History  . Marital status: Single    Spouse name: Not on file  . Number of children: Not on file  . Years of education: Not on file  . Highest education level: Not on file  Occupational History  . Not on file  Tobacco Use  . Smoking status: Never Smoker  . Smokeless tobacco: Never Used  Vaping Use  . Vaping Use: Never used  Substance and  Sexual Activity  . Alcohol use: No  . Drug use: No  . Sexual activity: Not on file  Other Topics Concern  . Not on file  Social History Narrative  . Not on file   Social Determinants of Health   Financial Resource Strain: Not on file  Food Insecurity: Not on file  Transportation Needs: Not on file  Physical Activity: Not on file  Stress: Not on file  Social Connections: Not on file  Intimate Partner Violence: Not on file     PHYSICAL EXAM:  VS: BP 120/70   Ht 5\' 5"  (1.651 m)   Wt 208 lb (94.3 kg)   BMI 34.61 kg/m  Physical Exam Gen: NAD, alert, cooperative with exam, well-appearing MSK:  Left shoulder: Normal external rotation. Pain worsens with abduction and external rotation. Normal strength resistance. Neurovascularly intact  Limited ultrasound: Left shoulder:  Mild effusion of the proximal portion of the biceps tendon. Normal-appearing subscapularis. Supraspinatus is mild degenerative changes. AC joint with effusion but no increased hyperemia. Normal-appearing posterior glenohumeral joint.  Summary: Mild degenerative changes appreciated of the [facility] joint.  Ultrasound and interpretation by [facility], MD    ASSESSMENT & PLAN:   Shoulder subluxation, left, initial  encounter Injury occurred on 1/22.  Has global pain seems more likely a subluxation as rotator cuff appears to be in good shape. -Counseled on home exercise therapy and supportive care. -Counseled on sling. -Prednisone. -Could consider injection or physical therapy.

## 2021-02-22 ENCOUNTER — Ambulatory Visit: Payer: 59 | Admitting: Family Medicine

## 2021-02-22 NOTE — Progress Notes (Unsigned)
  Leslie Gentry - 50 y.o. female MRN [id number]  Date of birth: 01-13-1971  SUBJECTIVE:  Including CC & ROS.  No chief complaint on file.   Leslie Gentry is a 50 y.o. female that is  ***.  ***   Review of Systems See HPI   HISTORY: Past Medical, Surgical, Social, and Family History Reviewed & Updated per EMR.   Pertinent Historical Findings include:  Past Medical History:  Diagnosis Date  . Anemia   . Chronic back pain   . CSF leak from nose   . Diabetes mellitus   . Gallstones   . H/O heart artery stent 05/15/2017  . H/O hiatal hernia   . Headache(784.0)    hx of migraines   . Hypertension   . Meningitis   . Migraine   . Rheumatoid arthritis [facility])     Past Surgical History:  Procedure Laterality Date  . ABDOMINAL HYSTERECTOMY    . APPENDECTOMY    . CESAREAN SECTION    . CHOLECYSTECTOMY N/A 12/28/2013   Procedure: LAPAROSCOPIC CHOLECYSTECTOMY WITH INTRAOPERATIVE CHOLANGIOGRAM;  Surgeon: Romie Levee, MD;  Location: [facility];  Service: General;  Laterality: N/A;  . CORONARY BALLOON ANGIOPLASTY    . CORONARY STENT INTERVENTION    . HERNIA REPAIR    . HIP SURGERY    . RIGHT AND LEFT HEART CATH    . TONSILLECTOMY      No family history on file.  Social History   Socioeconomic History  . Marital status: Single    Spouse name: Not on file  . Number of children: Not on file  . Years of education: Not on file  . Highest education level: Not on file  Occupational History  . Not on file  Tobacco Use  . Smoking status: Never Smoker  . Smokeless tobacco: Never Used  Vaping Use  . Vaping Use: Never used  Substance and Sexual Activity  . Alcohol use: No  . Drug use: No  . Sexual activity: Not on file  Other Topics Concern  . Not on file  Social History Narrative  . Not on file   Social Determinants of Health   Financial Resource Strain: Not on file  Food Insecurity: Not on file  Transportation Needs: Not on file  Physical Activity: Not on  file  Stress: Not on file  Social Connections: Not on file  Intimate Partner Violence: Not on file     PHYSICAL EXAM:  VS: There were no vitals taken for this visit. Physical Exam Gen: NAD, alert, cooperative with exam, well-appearing MSK:  ***      ASSESSMENT & PLAN:   No problem-specific Assessment & Plan notes found for this encounter.

## 2023-04-17 ENCOUNTER — Encounter: Payer: Self-pay | Admitting: *Deleted

## 2023-12-19 ENCOUNTER — Other Ambulatory Visit: Payer: Self-pay

## 2023-12-19 ENCOUNTER — Emergency Department (HOSPITAL_BASED_OUTPATIENT_CLINIC_OR_DEPARTMENT_OTHER)
Admission: EM | Admit: 2023-12-19 | Discharge: 2023-12-19 | Disposition: A | Discharge status: Home / Self Care | Payer: 59 | Attending: Emergency Medicine | Admitting: Emergency Medicine

## 2023-12-19 ENCOUNTER — Encounter (HOSPITAL_BASED_OUTPATIENT_CLINIC_OR_DEPARTMENT_OTHER): Payer: Self-pay

## 2023-12-19 DIAGNOSIS — J069 Acute upper respiratory infection, unspecified: Secondary | ICD-10-CM | POA: Diagnosis not present

## 2023-12-19 DIAGNOSIS — Z7984 Long term (current) use of oral hypoglycemic drugs: Secondary | ICD-10-CM | POA: Insufficient documentation

## 2023-12-19 DIAGNOSIS — Z79899 Other long term (current) drug therapy: Secondary | ICD-10-CM | POA: Diagnosis not present

## 2023-12-19 DIAGNOSIS — I10 Essential (primary) hypertension: Secondary | ICD-10-CM | POA: Diagnosis not present

## 2023-12-19 DIAGNOSIS — H9203 Otalgia, bilateral: Secondary | ICD-10-CM | POA: Diagnosis not present

## 2023-12-19 DIAGNOSIS — E119 Type 2 diabetes mellitus without complications: Secondary | ICD-10-CM | POA: Insufficient documentation

## 2023-12-19 DIAGNOSIS — Z20822 Contact with and (suspected) exposure to covid-19: Secondary | ICD-10-CM | POA: Diagnosis not present

## 2023-12-19 DIAGNOSIS — R059 Cough, unspecified: Secondary | ICD-10-CM | POA: Diagnosis present

## 2023-12-19 LAB — RESP PANEL BY RT-PCR (RSV, FLU A&B, COVID)  RVPGX2
Influenza A by PCR: NEGATIVE
Influenza B by PCR: NEGATIVE
Resp Syncytial Virus by PCR: NEGATIVE
SARS Coronavirus 2 by RT PCR: NEGATIVE

## 2023-12-19 NOTE — Discharge Instructions (Addendum)
Thank you for allowing [place] to be a part of your care today.  You were evaluated for cold-like symptoms.  He tested negative for COVID, influenza, and RSV.  For symptomatic management these are my over-the-counter recommendations: For nasal congestion, use an antihistamine such as Zyrtec or Xyzal.  Generic versions of these medications are acceptable.  You may also use Afrin nasal spray.  Do not use for more than 3 days as this will cause rebound congestion and runny nose. For cough, take medication such as Robitussin or Delsym.  The active ingredient dextromethorphan is a good cough suppressant. For sore throat, use throat sprays such as Chloraseptic or lozenges such as Cepacol.  You may also use other lozenges to help coat the throat (Halls, Ricola, etc). For headache, fever, body aches take Tylenol and/or ibuprofen.  You may alternate these medications every 4 hours as needed.  Do not exceed 4000 mg of Tylenol or 3200 mg of ibuprofen in a 24-hour period.  If you begin to develop high fevers and/or have worsening symptoms, seek immediate medical attention/return to the ED as you may have developed a secondary bacterial infection given your immunocompromised status.

## 2023-12-19 NOTE — ED Provider Notes (Signed)
[facility]ERG[facility]PARTMENT AT [facility] Provider Note   CSN: [id number] Arrival date & time: 12/19/23  1237     History  Chief Complaint  Patient presents with   Cough   Sore Throat   Nasal Congestion    Leslie Gentry is a 52 y.o. female with past medical history significant for hypertension, type 2 diabetes, anemia, RA presents to the ED complaining of cough, congestion, headache and sore throat that began yesterday.  Endorses bilateral ear pain also.  Patient reports that there was someone at work with similar symptoms and she believes that she became ill due to this.  She has not taken any medications to aid with her symptoms.  Denies fever, shortness of breath, chest pain, trouble swallowing, voice change.       Home Medications Prior to Admission medications   Medication Sig Start Date End Date Taking? Authorizing Provider  amitriptyline (ELAVIL) 10 MG tablet Take 40 mg by mouth at bedtime.    [provider]  amLODipine (NORVASC) 10 MG tablet Take 10 mg by mouth daily.    [provider]  cephALEXin (KEFLEX) 500 MG capsule Take 1 capsule (500 mg total) by mouth 4 (four) times daily. 07/26/17   Rise Mu, PA-C  diclofenac Sodium (VOLTAREN) 1 % GEL Apply 2 g topically 4 (four) times daily. 11/15/20   Couture, Cortni S, PA-C  gabapentin (NEURONTIN) 300 MG capsule Take 300 mg by mouth 3 (three) times daily.    [provider]  glipiZIDE (GLUCOTROL) 5 MG tablet Take by mouth daily before breakfast.    [provider]  HYDROcodone-acetaminophen (NORCO/VICODIN) 5-325 MG tablet Take 1 tablet by mouth every 4 (four) hours as needed. 10/26/18   Jacalyn Lefevre, MD  losartan (COZAAR) 25 MG tablet Take 50 mg by mouth daily.     [provider]  metaxalone (SKELAXIN) 800 MG tablet Take 1 tablet (800 mg total) by mouth 3 (three) times daily. 01/08/17   Lattie Haw, MD  metFORMIN (GLUCOPHAGE) 1000 MG tablet  Take 1,000 mg by mouth 2 (two) times daily with a meal.    [provider]  methocarbamol (ROBAXIN) 500 MG tablet Take 500 mg by mouth 4 (four) times daily.    [provider]  naproxen (NAPROSYN) 500 MG tablet Take 1 tablet (500 mg total) by mouth 2 (two) times daily. (every 12 hours with food) 01/08/17   Lattie Haw, MD  ondansetron (ZOFRAN ODT) 4 MG disintegrating tablet Take 1 tablet (4 mg total) by mouth every 8 (eight) hours as needed for nausea or vomiting. 08/01/16   Santiago Glad, PA-C  predniSONE (DELTASONE) 5 MG tablet Take 6 pills for first day, 5 pills second day, 4 pills third day, 3 pills fourth day, 2 pills the fifth day, and 1 pill sixth day. 01/23/21   Myra Rude, MD  ticagrelor (BRILINTA) 90 MG TABS tablet Take by mouth 2 (two) times daily.    [provider]  TiZANidine HCl (ZANAFLEX PO) Take by mouth.    [provider]      Allergies    Ketorolac, Chlorhexidine, Demerol [meperidine], Erythromycin, Toradol [ketorolac tromethamine], and Tramadol    Review of Systems   Review of Systems  Constitutional:  Negative for fever.  HENT:  Positive for congestion, ear pain and sore throat. Negative for trouble swallowing and voice change.   Respiratory:  Positive for cough. Negative for shortness of breath.   Cardiovascular:  Negative for chest pain.    Physical Exam Updated Vital Signs BP (!) 128/99   Pulse 74   Temp 98.1 F (36.7 C) (Oral)   Resp 16   Ht 5\' 5"  (1.651 m)   Wt 94 kg   SpO2 97%   BMI 34.49 kg/m  Physical Exam Vitals and nursing note reviewed.  Constitutional:      General: She is not in acute distress.    Appearance: Normal appearance. She is ill-appearing. She is not diaphoretic.  HENT:     Right Ear: Ear canal normal. A middle ear effusion (serous) is present. Tympanic membrane is not erythematous or bulging.     Left Ear: Ear canal normal. A middle ear effusion (serous) is present. Tympanic membrane is  not erythematous or bulging.     Nose: Congestion present. No rhinorrhea.     Mouth/Throat:     Lips: Pink.     Mouth: Mucous membranes are moist.     Pharynx: Oropharynx is clear. Uvula midline. Posterior oropharyngeal erythema (mild) and postnasal drip (minimal) present. No pharyngeal swelling or oropharyngeal exudate.  Cardiovascular:     Rate and Rhythm: Normal rate and regular rhythm.  Pulmonary:     Effort: Pulmonary effort is normal. No tachypnea, accessory muscle usage or respiratory distress.     Breath sounds: Normal breath sounds and air entry.  Skin:    General: Skin is warm and dry.     Capillary Refill: Capillary refill takes less than 2 seconds.  Neurological:     Mental Status: She is alert. Mental status is at baseline.  Psychiatric:        Mood and Affect: Mood normal.        Behavior: Behavior normal.     ED Results / Procedures / Treatments   Labs (all labs ordered are listed, but only abnormal results are displayed) Labs Reviewed  RESP PANEL BY RT-PCR (RSV, FLU A&B, COVID)  [id number]    EKG None  Radiology No results found.  Procedures Procedures    Medications Ordered in ED Medications - No data to display  ED Course/ Medical Decision Making/ A&P                                 Medical Decision Making  This patient presents to the ED with chief complaint(s) of URI symptoms, cough with pertinent past medical history of RA, type 2 diabetes, hypertension.  The complaint involves an extensive differential diagnosis and also carries with it a high risk of complications and morbidity.    The differential diagnosis includes Covid, influenza, RSV, other viral syndrome   Initial Assessment:   Exam significant for ill-appearing patient who is not in acute distress.  Lungs are clear to auscultation bilaterally.  No cough appreciated during exam.  Bilateral TMs with a serous middle ear effusion, no bulging or erythema to the TMs.  EACs are unremarkable.   Patient is congested, no rhinorrhea.  There is minimal postnasal drip.  Posterior oropharynx is mildly erythematous, no exudate.  She is speaking in full sentences.  Vital signs are stable and she is afebrile.  Independent interpretation of testing: Patient negative for COVID, influenza, and RSV  Disposition:   Patient's exam consistent with URI, likely viral in nature.  Discussed with patient over-the-counter recommendations medications for symptomatic management.  Patient inquired about antibiotic given her immunocompromise status.  Patient has only had symptoms for  1 day, I do not feel that antibiotic is appropriate at this time.  Discussed with patient worsening symptoms or developing high fevers may indicate secondary bacterial infection which she will require reevaluation at that time.  Patient verbalized her understanding.  The patient has been appropriately medically screened and/or stabilized in the ED. I have low suspicion for any other emergent medical condition which would require further screening, evaluation or treatment in the ED or require inpatient management. At time of discharge the patient is hemodynamically stable and in no acute distress. I have discussed work-up results and diagnosis with patient and answered all questions. Patient is agreeable with discharge plan. We discussed strict return precautions for returning to the emergency department and they verbalized understanding.            Final Clinical Impression(s) / ED Diagnoses Final diagnoses:  Viral URI with cough    Rx / DC Orders ED Discharge Orders     None         Lenard Simmer, PA-C 12/19/23 1431    Laurence Spates, MD 12/20/23 [phone]

## 2023-12-19 NOTE — ED Triage Notes (Signed)
The patient has cough, congestion, headache, sore throat today.

## 2024-09-27 ENCOUNTER — Encounter (HOSPITAL_BASED_OUTPATIENT_CLINIC_OR_DEPARTMENT_OTHER): Payer: Self-pay | Admitting: Emergency Medicine

## 2024-09-27 ENCOUNTER — Other Ambulatory Visit: Payer: Self-pay

## 2024-09-27 ENCOUNTER — Emergency Department (HOSPITAL_BASED_OUTPATIENT_CLINIC_OR_DEPARTMENT_OTHER)
Admission: EM | Admit: 2024-09-27 | Discharge: 2024-09-27 | Disposition: A | Discharge status: Home / Self Care | Attending: Emergency Medicine | Admitting: Emergency Medicine

## 2024-09-27 ENCOUNTER — Emergency Department (HOSPITAL_BASED_OUTPATIENT_CLINIC_OR_DEPARTMENT_OTHER)

## 2024-09-27 DIAGNOSIS — W19XXXA Unspecified fall, initial encounter: Secondary | ICD-10-CM | POA: Diagnosis not present

## 2024-09-27 DIAGNOSIS — S7002XA Contusion of left hip, initial encounter: Secondary | ICD-10-CM | POA: Insufficient documentation

## 2024-09-27 DIAGNOSIS — S79912A Unspecified injury of left hip, initial encounter: Secondary | ICD-10-CM | POA: Diagnosis present

## 2024-09-27 DIAGNOSIS — M160 Bilateral primary osteoarthritis of hip: Secondary | ICD-10-CM | POA: Diagnosis not present

## 2024-09-27 MED ORDER — METHOCARBAMOL 750 MG PO TABS: 750.0000 mg | ORAL_TABLET | Freq: Three times a day (TID) | ORAL | 0 refills | Status: AC | PRN

## 2024-09-27 NOTE — ED Provider Notes (Signed)
 [facility] Provider Note   CSN: [id number] Arrival date & time: 09/27/24  9141     Patient presents with: [place]   Leslie Gentry is a 53 y.o. female.   Pt with c/o fall four days ago with contusion to left hip, pain to left hip area. Constant, dull, non radiating. Has been ambulatory. States normally walks with cane, and just had lost her balance, fallen - denies faintness or dizziness prior to fall. No loc w fall. No head injury or headaches since fall. No neck or back pain. No knee pain. No radicular pain. No numbness. No extremity weakness or loss of normal/baseline functional ability. No problems with normal gi/gu function. No fever or chills. No anticoagulant use.   The history is provided by the patient.  Fall Pertinent negatives include no chest pain, no abdominal pain, no headaches and no shortness of breath.       Prior to Admission medications   Medication Sig Start Date End Date Taking? Authorizing Provider  methocarbamol  (ROBAXIN ) 750 MG tablet Take 1 tablet (750 mg total) by mouth 3 (three) times daily as needed (muscle spasm/pain). 09/27/24  Yes Bernard Drivers, MD  amitriptyline (ELAVIL) 10 MG tablet Take 40 mg by mouth at bedtime.    [provider]  amLODipine  (NORVASC ) 10 MG tablet Take 10 mg by mouth daily.    [provider]  gabapentin (NEURONTIN) 300 MG capsule Take 300 mg by mouth 3 (three) times daily.    [provider]  glipiZIDE (GLUCOTROL) 5 MG tablet Take by mouth daily before breakfast.    [provider]  losartan (COZAAR) 25 MG tablet Take 50 mg by mouth daily.     [provider]  metFORMIN (GLUCOPHAGE) 1000 MG tablet Take 1,000 mg by mouth 2 (two) times daily with a meal.    [provider]  ticagrelor (BRILINTA) 90 MG TABS tablet Take by mouth 2 (two) times daily.    [provider]    Allergies: Ketorolac , Chlorhexidine, Demerol  [meperidine], Erythromycin, Toradol  [ketorolac  tromethamine ], and Tramadol     Review of Systems  Constitutional:  Negative for chills and fever.  HENT:  Negative for sore throat.   Eyes:  Negative for visual disturbance.  Respiratory:  Negative for cough and shortness of breath.   Cardiovascular:  Negative for chest pain, palpitations and leg swelling.  Gastrointestinal:  Negative for abdominal pain, blood in stool, nausea and vomiting.  Genitourinary:  Negative for dysuria and flank pain.  Musculoskeletal:  Negative for back pain and neck pain.  Skin:  Negative for wound.  Neurological:  Negative for weakness, numbness and headaches.  Psychiatric/Behavioral:  Negative for confusion.     Updated Vital Signs BP 114/77 (BP Location: Left Arm)   Pulse 77   Temp 98.1 F (36.7 C) (Oral)   Resp 17   Ht 1.651 m (5' 5)   Wt 82.6 kg   SpO2 99%   BMI 30.29 kg/m   Physical Exam Vitals and nursing note reviewed.  Constitutional:      Appearance: Normal appearance. She is well-developed.  HENT:     Head: Atraumatic.     Nose: Nose normal.     Mouth/Throat:     Mouth: Mucous membranes are moist.  Eyes:     General: No scleral icterus.    Conjunctiva/sclera: Conjunctivae normal.     Pupils: Pupils are equal, round, and reactive to light.  Neck:  Vascular: No carotid bruit.     Trachea: No tracheal deviation.  Cardiovascular:     Rate and Rhythm: Normal rate and regular rhythm.     Pulses: Normal pulses.     Heart sounds: Normal heart sounds. No murmur heard.    No friction rub. No gallop.  Pulmonary:     Effort: Pulmonary effort is normal. No respiratory distress.     Breath sounds: Normal breath sounds.  Chest:     Chest wall: No tenderness.  Abdominal:     General: Bowel sounds are normal. There is no distension.     Palpations: Abdomen is soft.     Tenderness: There is no abdominal tenderness. There is no guarding.  Genitourinary:    Comments: No cva tenderness.   Musculoskeletal:        General: No swelling.     Cervical back: Normal range of motion and neck supple. No rigidity. No muscular tenderness.     Comments: CTLS spine, non tender, aligned, no step off. Mild tenderness left hip laterally. No significant sts noted. Good passive rom left hip, knee and ankle without pain. LLE is of normal color and warmth, without swelling, and with distal pulses intact.   Skin:    General: Skin is warm and dry.     Findings: No rash.  Neurological:     Mental Status: She is alert.     Comments: Alert, speech normal. Gcs 15. Motor/sens grossly intact bil.   Psychiatric:        Mood and Affect: Mood normal.     (all labs ordered are listed, but only abnormal results are displayed) Labs Reviewed - No data to display  EKG: EKG Interpretation Date/Time:  Monday September 27 2024 09:22:48 EDT Ventricular Rate:  64 PR Interval:  216 QRS Duration:  95 QT Interval:  424 QTC Calculation: 438 R Axis:   -18  Text Interpretation: Sinus rhythm Prolonged PR interval Non-specific ST-t changes Confirmed by Bernard Drivers ([id number]) on 09/27/2024 10:57:57 AM  Radiology: ARCOLA Humerus Left Result Date: 09/27/2024 CLINICAL DATA:  Fall.  Left upper arm pain. EXAM: LEFT HUMERUS - 2+ VIEW COMPARISON:  None Available. FINDINGS: There is no evidence of fracture or other focal bone lesions. Soft tissues are unremarkable. IMPRESSION: Negative. Electronically Signed   By: Norleen DELENA Kil M.D.   On: 09/27/2024 10:35   DG Hip Unilat W or Wo Pelvis 2-3 Views Left Result Date: 09/27/2024 CLINICAL DATA:  Fall 4 days ago.  Left hip pain. EXAM: DG HIP (WITH OR WITHOUT PELVIS) 2-3V LEFT COMPARISON:  None Available. FINDINGS: There is no evidence of hip fracture or dislocation. There is no evidence of arthropathy or other focal bone lesion of the left hip. Severe right hip osteoarthritis noted. IMPRESSION: No acute findings. Severe right hip osteoarthritis. Electronically Signed   By: Norleen DELENA Kil M.D.   On: 09/27/2024 10:34     Procedures   Medications Ordered in the ED - No data to display                                  Medical Decision Making Problems Addressed: Accidental fall, initial encounter: acute illness or injury with systemic symptoms Contusion of left hip, initial encounter: acute illness or injury with systemic symptoms Primary osteoarthritis of both hips: chronic illness or injury  Amount and/or Complexity of Data Reviewed External Data Reviewed: notes. Radiology: ordered and independent  interpretation performed. Decision-making details documented in ED Course. ECG/medicine tests: ordered and independent interpretation performed. Decision-making details documented in ED Course.  Risk Prescription drug management.   Imaging ordered.   Reviewed nursing notes and prior charts for additional history. External reports reviewed.   Xrays reviewed/interpreted by me - no fx.   Pt took acetaminophen  at home.   Ibuprofen  po. Rx robaxin .   Ambulates w steady gait. Pt appears stable for ED d/c.   Rec close pcp f/u.  Return precautions provided.       Final diagnoses:  Accidental fall, initial encounter  Contusion of left hip, initial encounter  Primary osteoarthritis of both hips    ED Discharge Orders          Ordered    methocarbamol  (ROBAXIN ) 750 MG tablet  3 times daily PRN        09/27/24 1019               Bernard Drivers, MD 09/27/24 1059

## 2024-09-27 NOTE — ED Triage Notes (Signed)
 Fell 4 days ago , reports felt dizzy prior to fall , lost her balance , landed on her left side . Upper arm bruising , left hip and leg pain .  Wheeled to triage , ambulates with cane .

## 2024-09-27 NOTE — Discharge Instructions (Addendum)
 It was our pleasure to provide your ER care today - we hope that you feel better.  Your xrays look good - no fractures are seen.   Take acetaminophen  or ibuprofen  as need for pain. You may also take robaxin  as need for muscle pain/spasm - no driving when taking.   Follow up with primary care doctor in 1-2 weeks if symptoms fail to improve/resolve.  Return to ER if worse, new symptoms, new/severe pain, trouble breathing, or other emergency concern.
